# Patient Record
Sex: Female | Born: 1935 | Race: Black or African American | Hispanic: No | Smoking: Never smoker
Health system: Southern US, Community
[De-identification: ages and names within clinical notes are randomized; demographics above are authoritative.]

## PROBLEM LIST (undated history)

## (undated) DIAGNOSIS — I639 Cerebral infarction, unspecified: Secondary | ICD-10-CM

## (undated) DIAGNOSIS — I10 Essential (primary) hypertension: Secondary | ICD-10-CM

---

## 2018-08-27 ENCOUNTER — Emergency Department (HOSPITAL_COMMUNITY)
Admission: EM | Admit: 2018-08-27 | Discharge: 2018-08-27 | Disposition: A | Discharge status: Home / Self Care | Payer: Medicaid Other | Attending: Emergency Medicine | Admitting: Emergency Medicine

## 2018-08-27 ENCOUNTER — Emergency Department (HOSPITAL_COMMUNITY): Payer: Medicaid Other

## 2018-08-27 ENCOUNTER — Encounter (HOSPITAL_COMMUNITY): Payer: Self-pay | Admitting: Emergency Medicine

## 2018-08-27 DIAGNOSIS — W19XXXA Unspecified fall, initial encounter: Secondary | ICD-10-CM

## 2018-08-27 DIAGNOSIS — M25512 Pain in left shoulder: Secondary | ICD-10-CM | POA: Diagnosis not present

## 2018-08-27 HISTORY — DX: Cerebral infarction, unspecified: I63.9

## 2018-08-27 LAB — URINALYSIS, ROUTINE W REFLEX MICROSCOPIC
Bilirubin Urine: NEGATIVE
GLUCOSE, UA: NEGATIVE mg/dL
Ketones, ur: 5 mg/dL — AB
Leukocytes, UA: NEGATIVE
Nitrite: NEGATIVE
PROTEIN: NEGATIVE mg/dL
Specific Gravity, Urine: 1.01 (ref 1.005–1.030)
pH: 7 (ref 5.0–8.0)

## 2018-08-27 LAB — BASIC METABOLIC PANEL
Anion gap: 12 (ref 5–15)
BUN: 14 mg/dL (ref 8–23)
CALCIUM: 8.6 mg/dL — AB (ref 8.9–10.3)
CO2: 25 mmol/L (ref 22–32)
Chloride: 102 mmol/L (ref 98–111)
Creatinine, Ser: 1.02 mg/dL — ABNORMAL HIGH (ref 0.44–1.00)
GFR calc Af Amer: 59 mL/min — ABNORMAL LOW (ref >60)
GFR calc non Af Amer: 51 mL/min — ABNORMAL LOW (ref >60)
Glucose, Bld: 94 mg/dL (ref 70–99)
Potassium: 2.9 mmol/L — ABNORMAL LOW (ref 3.5–5.1)
Sodium: 139 mmol/L (ref 135–145)

## 2018-08-27 LAB — CBC WITH DIFFERENTIAL/PLATELET
Abs Immature Granulocytes: 0.03 10*3/uL (ref 0.00–0.07)
Basophils Absolute: 0 10*3/uL (ref 0.0–0.1)
Basophils Relative: 0 %
Eosinophils Absolute: 0.1 10*3/uL (ref 0.0–0.5)
Eosinophils Relative: 1 %
HCT: 37.4 % (ref 36.0–46.0)
Hemoglobin: 11.8 g/dL — ABNORMAL LOW (ref 12.0–15.0)
Immature Granulocytes: 0 %
Lymphocytes Relative: 18 %
Lymphs Abs: 1.3 10*3/uL (ref 0.7–4.0)
MCH: 27.1 pg (ref 26.0–34.0)
MCHC: 31.6 g/dL (ref 30.0–36.0)
MCV: 85.8 fL (ref 80.0–100.0)
Monocytes Absolute: 0.7 10*3/uL (ref 0.1–1.0)
Monocytes Relative: 10 %
Neutro Abs: 5.2 10*3/uL (ref 1.7–7.7)
Neutrophils Relative %: 71 %
Platelets: 134 10*3/uL — ABNORMAL LOW (ref 150–400)
RBC: 4.36 MIL/uL (ref 3.87–5.11)
RDW: 13.1 % (ref 11.5–15.5)
WBC: 7.3 10*3/uL (ref 4.0–10.5)
nRBC: 0 % (ref 0.0–0.2)

## 2018-08-27 LAB — I-STAT TROPONIN, ED: Troponin i, poc: 0.01 ng/mL (ref 0.00–0.08)

## 2018-08-27 LAB — MAGNESIUM: Magnesium: 2 mg/dL (ref 1.7–2.4)

## 2018-08-27 MED ORDER — POTASSIUM CHLORIDE 20 MEQ/15ML (10%) PO SOLN
40.0000 meq | Freq: Once | ORAL | Status: AC
  Administered 2018-08-27: 40 meq via ORAL
  Filled 2018-08-27: qty 30

## 2018-08-27 MED ORDER — SODIUM CHLORIDE 0.9 % IV BOLUS
500.0000 mL | Freq: Once | INTRAVENOUS | Status: AC
  Administered 2018-08-27: 500 mL via INTRAVENOUS

## 2018-08-27 MED ORDER — ACETAMINOPHEN 325 MG PO TABS
650.0000 mg | ORAL_TABLET | Freq: Once | ORAL | Status: AC
  Administered 2018-08-27: 650 mg via ORAL
  Filled 2018-08-27: qty 2

## 2018-08-27 NOTE — ED Provider Notes (Signed)
Assumed care of patient at 4 PM from Dr. Gwenlyn Fudge.  Patient awaiting urinalysis.  Had a recent fall.  General complaints.  Thus far work-up is unremarkable.  Had some potassium repletion.  Overall unremarkable lab work.  Patient had negative x-rays of her shoulder.  Urinalysis came back without any signs of infection and patient was discharged in the ED in good condition.  Recommend follow-up with primary care doctor.   Virgina Norfolk, DO 08/27/18 1639

## 2018-08-27 NOTE — ED Provider Notes (Signed)
[facility] EMERGENCY [facility]MENT Provider Note   CSN: [id number] Arrival date & time: 08/27/18  1331     History   Chief Complaint Chief Complaint  Patient presents with  . Fall  . Shortness of Breath  . Chest Pain    HPI Emma Crawford is a 85 y.o. female.  HPI  85 year old female presents with left-sided shoulder and back pain.  On 1/16 she fell while stumbling backwards when trying to hold onto her walker.  Daughter witnessed it and she hit her left posterior shoulder on a chair.  She did not hit her head or lose consciousness.  At first did not seem to have any complaints but then over the last few days has described left posterior shoulder pain.  She chronically has left arm pain from a prior stroke with left-sided deficits.  She is also been eating and drinking less or at least having less appetite.  When the daughter gives her drink or applesauce she will drink it.  She might of been a little short of breath a few days ago but none now and no cough, chest pain.  Went to PCPs office where their EKG machine was broken and she was referred here.  Has not received anything for the pain.  Past Medical History:  Diagnosis Date  . Stroke [facility](HCC)     There are no active problems to display for this patient.   History reviewed. No pertinent surgical history.   OB History   No obstetric history on file.      Home Medications    Prior to Admission medications   Not on File    Family History History reviewed. No pertinent family history.  Social History Social History   Tobacco Use  . Smoking status: Never Smoker  . Smokeless tobacco: Former NeurosurgeonUser    Types: Snuff  Substance Use Topics  . Alcohol use: Not on file  . Drug use: Not on file     Allergies   Patient has no allergy information on record.   Review of Systems Review of Systems  Unable to perform ROS: Other     Physical Exam Updated Vital Signs BP (!) 187/94 (BP  Location: Right Arm)   Pulse 77   Temp 98 F (36.7 C) (Oral)   Resp 16   SpO2 100%   Physical Exam Vitals signs and nursing note reviewed.  Constitutional:      Appearance: She is well-developed.  HENT:     Head: Normocephalic and atraumatic.     Right Ear: External ear normal.     Left Ear: External ear normal.     Nose: Nose normal.  Eyes:     General:        Right eye: No discharge.        Left eye: No discharge.  Cardiovascular:     Rate and Rhythm: Normal rate and regular rhythm.     Heart sounds: Normal heart sounds.  Pulmonary:     Effort: Pulmonary effort is normal.     Breath sounds: Normal breath sounds.  Abdominal:     Palpations: Abdomen is soft.     Tenderness: There is no abdominal tenderness.  Musculoskeletal:     Left shoulder: She exhibits tenderness.     Left upper arm: She exhibits tenderness.  Skin:    General: Skin is warm and dry.  Neurological:     Mental Status: She is alert.     Comments: Awake,  alert. 5/5 strength in RUE, RLE, LLE. LUE weaker but also in pain. Some contracture in LUE  Psychiatric:        Mood and Affect: Mood is not anxious.      ED Treatments / Results  Labs (all labs ordered are listed, but only abnormal results are displayed) Labs Reviewed  BASIC METABOLIC PANEL - Abnormal; Notable for the following components:      Result Value   Potassium 2.9 (*)    Creatinine, Ser 1.02 (*)    Calcium 8.6 (*)    GFR calc non Af Amer 51 (*)    GFR calc Af Amer 59 (*)    All other components within normal limits  CBC WITH DIFFERENTIAL/PLATELET - Abnormal; Notable for the following components:   Hemoglobin 11.8 (*)    Platelets 134 (*)    All other components within normal limits  URINALYSIS, ROUTINE W REFLEX MICROSCOPIC  MAGNESIUM  I-STAT TROPONIN, ED    EKG EKG Interpretation  Date/Time:  Monday August 27 2018 13:51:00 EST Ventricular Rate:  69 PR Interval:    QRS Duration: 88 QT Interval:  431 QTC  Calculation: 462 R Axis:   -8 Text Interpretation:  Sinus rhythm Abnormal R-wave progression, early transition Borderline T wave abnormalities No old tracing to compare Confirmed by Pricilla LovelessGoldston, Asyia Hornung [phone]([id number]) on 08/27/2018 2:27:03 PM   Radiology Dg Chest 2 View  Result Date: 08/27/2018 CLINICAL DATA:  Fall with shoulder pain EXAM: CHEST - 2 VIEW COMPARISON:  None. FINDINGS: The heart size and mediastinal contours are within normal limits. Both lungs are clear. The visualized skeletal structures are unremarkable. IMPRESSION: No active cardiopulmonary disease. Electronically Signed   By: Deatra RobinsonKevin  Herman M.D.   On: 08/27/2018 14:50   Dg Shoulder Left  Result Date: 08/27/2018 CLINICAL DATA:  Recent fall with shoulder pain, initial encounter EXAM: LEFT SHOULDER - 2+ VIEW COMPARISON:  None. FINDINGS: Degenerative changes of the acromioclavicular joint are noted. No acute fracture or dislocation is seen. No soft tissue abnormality is noted. IMPRESSION: Degenerative change without acute abnormality. Electronically Signed   By: Alcide CleverMark  Lukens M.D.   On: 08/27/2018 14:51   Dg Humerus Left  Result Date: 08/27/2018 CLINICAL DATA:  Left scapular pain after fall 2 weeks ago. EXAM: LEFT HUMERUS - 2+ VIEW COMPARISON:  Same day shoulder radiographs FINDINGS: The included AC and glenohumeral joints are intact. The elbow joint appears maintained. Left humerus is intact. No joint dislocation is seen. The included views of the scapula appear intact. No adjacent rib fracture is identified. No significant soft tissue abnormality. Ossifications along the expected course of the triceps tendon is noted compatible with triceps tendinosis. IMPRESSION: No acute osseous abnormality of the left humerus. Electronically Signed   By: Tollie Ethavid  Kwon M.D.   On: 08/27/2018 14:54    Procedures Procedures (including critical care time)  Medications Ordered in ED Medications  sodium chloride 0.9 % bolus 500 mL (500 mLs Intravenous New  Bag/Given 08/27/18 1456)  potassium chloride 20 MEQ/15ML (10%) solution 40 mEq (has no administration in time range)  acetaminophen (TYLENOL) tablet 650 mg (650 mg Oral Given 08/27/18 1447)     Initial Impression / Assessment and Plan / ED Course  I have reviewed the triage vital signs and the nursing notes.  Pertinent labs & imaging results that were available during my care of the patient were reviewed by me and considered in my medical decision making (see chart for details).     Patient's fall does  not appear to be syncope or near syncope.  She did not hit her head or lose consciousness and this was several days ago.  X-rays have been obtained and are benign.  Given the generalized weakness endorsed by daughter, labs will be obtained.  Urinalysis pending.  Given no head injury, altered mental state, or new focal weakness, I do not think CT head would be beneficial.  Discussed this with daughter who agrees. Care transferred to Dr. Lockie Mola  Final Clinical Impressions(s) / ED Diagnoses   Final diagnoses:  None    ED Discharge Orders    None       Pricilla Loveless, MD 08/27/18 1554

## 2018-08-27 NOTE — ED Triage Notes (Signed)
Pt to ER for evaluation after a fall and shortness of breath. Pain to left shoulder. Sent here by PCP for further evaluation. States mechanical fall when getting up with walker, fell back and hit her left shoulder. Denies chest pain.

## 2019-01-23 ENCOUNTER — Encounter (HOSPITAL_COMMUNITY): Payer: Self-pay

## 2019-01-23 ENCOUNTER — Emergency Department (HOSPITAL_COMMUNITY): Payer: Medicaid Other

## 2019-01-23 ENCOUNTER — Emergency Department (HOSPITAL_COMMUNITY)
Admission: EM | Admit: 2019-01-23 | Discharge: 2019-01-23 | Disposition: A | Discharge status: Home / Self Care | Payer: Medicaid Other | Attending: Emergency Medicine | Admitting: Emergency Medicine

## 2019-01-23 ENCOUNTER — Other Ambulatory Visit: Payer: Self-pay

## 2019-01-23 DIAGNOSIS — Z7982 Long term (current) use of aspirin: Secondary | ICD-10-CM | POA: Diagnosis not present

## 2019-01-23 DIAGNOSIS — R112 Nausea with vomiting, unspecified: Secondary | ICD-10-CM | POA: Diagnosis present

## 2019-01-23 DIAGNOSIS — R1084 Generalized abdominal pain: Secondary | ICD-10-CM

## 2019-01-23 DIAGNOSIS — Z79899 Other long term (current) drug therapy: Secondary | ICD-10-CM | POA: Diagnosis not present

## 2019-01-23 DIAGNOSIS — Z87891 Personal history of nicotine dependence: Secondary | ICD-10-CM | POA: Diagnosis not present

## 2019-01-23 DIAGNOSIS — I10 Essential (primary) hypertension: Secondary | ICD-10-CM | POA: Diagnosis not present

## 2019-01-23 DIAGNOSIS — K5900 Constipation, unspecified: Secondary | ICD-10-CM | POA: Insufficient documentation

## 2019-01-23 DIAGNOSIS — Z8673 Personal history of transient ischemic attack (TIA), and cerebral infarction without residual deficits: Secondary | ICD-10-CM | POA: Insufficient documentation

## 2019-01-23 DIAGNOSIS — Z880 Allergy status to penicillin: Secondary | ICD-10-CM | POA: Insufficient documentation

## 2019-01-23 HISTORY — DX: Essential (primary) hypertension: I10

## 2019-01-23 LAB — COMPREHENSIVE METABOLIC PANEL
ALT: 14 U/L (ref 0–44)
AST: 21 U/L (ref 15–41)
Albumin: 3.5 g/dL (ref 3.5–5.0)
Alkaline Phosphatase: 84 U/L (ref 38–126)
Anion gap: 15 (ref 5–15)
BUN: 22 mg/dL (ref 8–23)
CO2: 22 mmol/L (ref 22–32)
Calcium: 9.3 mg/dL (ref 8.9–10.3)
Chloride: 103 mmol/L (ref 98–111)
Creatinine, Ser: 1.09 mg/dL — ABNORMAL HIGH (ref 0.44–1.00)
GFR calc Af Amer: 54 mL/min — ABNORMAL LOW (ref >60)
GFR calc non Af Amer: 47 mL/min — ABNORMAL LOW (ref >60)
Glucose, Bld: 131 mg/dL — ABNORMAL HIGH (ref 70–99)
Potassium: 3.2 mmol/L — ABNORMAL LOW (ref 3.5–5.1)
Sodium: 140 mmol/L (ref 135–145)
Total Bilirubin: 1.9 mg/dL — ABNORMAL HIGH (ref 0.3–1.2)
Total Protein: 7.6 g/dL (ref 6.5–8.1)

## 2019-01-23 LAB — URINALYSIS, ROUTINE W REFLEX MICROSCOPIC
Bilirubin Urine: NEGATIVE
Glucose, UA: NEGATIVE mg/dL
Hgb urine dipstick: NEGATIVE
Ketones, ur: 20 mg/dL — AB
Leukocytes,Ua: NEGATIVE
Nitrite: NEGATIVE
Protein, ur: 30 mg/dL — AB
Specific Gravity, Urine: 1.02 (ref 1.005–1.030)
pH: 5 (ref 5.0–8.0)

## 2019-01-23 LAB — CBC WITH DIFFERENTIAL/PLATELET
Abs Immature Granulocytes: 0.04 10*3/uL (ref 0.00–0.07)
Basophils Absolute: 0 10*3/uL (ref 0.0–0.1)
Basophils Relative: 0 %
Eosinophils Absolute: 0 10*3/uL (ref 0.0–0.5)
Eosinophils Relative: 0 %
HCT: 33.6 % — ABNORMAL LOW (ref 36.0–46.0)
Hemoglobin: 10.9 g/dL — ABNORMAL LOW (ref 12.0–15.0)
Immature Granulocytes: 0 %
Lymphocytes Relative: 5 %
Lymphs Abs: 0.7 10*3/uL (ref 0.7–4.0)
MCH: 27.8 pg (ref 26.0–34.0)
MCHC: 32.4 g/dL (ref 30.0–36.0)
MCV: 85.7 fL (ref 80.0–100.0)
Monocytes Absolute: 0.5 10*3/uL (ref 0.1–1.0)
Monocytes Relative: 4 %
Neutro Abs: 11.7 10*3/uL — ABNORMAL HIGH (ref 1.7–7.7)
Neutrophils Relative %: 91 %
Platelets: 252 10*3/uL (ref 150–400)
RBC: 3.92 MIL/uL (ref 3.87–5.11)
RDW: 13.2 % (ref 11.5–15.5)
WBC: 13 10*3/uL — ABNORMAL HIGH (ref 4.0–10.5)
nRBC: 0 % (ref 0.0–0.2)

## 2019-01-23 LAB — I-STAT CHEM 8, ED
BUN: 30 mg/dL — ABNORMAL HIGH (ref 8–23)
Calcium, Ion: 1.07 mmol/L — ABNORMAL LOW (ref 1.15–1.40)
Chloride: 104 mmol/L (ref 98–111)
Creatinine, Ser: 0.8 mg/dL (ref 0.44–1.00)
Glucose, Bld: 128 mg/dL — ABNORMAL HIGH (ref 70–99)
HCT: 37 % (ref 36.0–46.0)
Hemoglobin: 12.6 g/dL (ref 12.0–15.0)
Potassium: 4.3 mmol/L (ref 3.5–5.1)
Sodium: 138 mmol/L (ref 135–145)
TCO2: 24 mmol/L (ref 22–32)

## 2019-01-23 LAB — LIPASE, BLOOD: Lipase: 24 U/L (ref 11–51)

## 2019-01-23 LAB — TROPONIN I: Troponin I: <0.03 ng/mL (ref <0.03)

## 2019-01-23 MED ORDER — LISINOPRIL 10 MG PO TABS
5.0000 mg | ORAL_TABLET | Freq: Once | ORAL | Status: AC
  Administered 2019-01-23: 16:00:00 5 mg via ORAL
  Filled 2019-01-23: qty 1

## 2019-01-23 MED ORDER — METOPROLOL SUCCINATE ER 25 MG PO TB24
25.0000 mg | ORAL_TABLET | Freq: Once | ORAL | Status: AC
  Administered 2019-01-23: 16:00:00 25 mg via ORAL
  Filled 2019-01-23: qty 1

## 2019-01-23 MED ORDER — ONDANSETRON HCL 4 MG/2ML IJ SOLN
4.0000 mg | Freq: Once | INTRAMUSCULAR | Status: AC
  Administered 2019-01-23: 4 mg via INTRAVENOUS
  Filled 2019-01-23: qty 2

## 2019-01-23 MED ORDER — SODIUM CHLORIDE 0.9 % IV BOLUS
1000.0000 mL | Freq: Once | INTRAVENOUS | Status: AC
  Administered 2019-01-23: 12:00:00 1000 mL via INTRAVENOUS

## 2019-01-23 MED ORDER — IOHEXOL 300 MG/ML  SOLN
100.0000 mL | Freq: Once | INTRAMUSCULAR | Status: AC | PRN
  Administered 2019-01-23: 100 mL via INTRAVENOUS

## 2019-01-23 MED ORDER — FLEET ENEMA 7-19 GM/118ML RE ENEM
1.0000 | ENEMA | Freq: Once | RECTAL | Status: AC
  Administered 2019-01-23: 15:00:00 1 via RECTAL
  Filled 2019-01-23: qty 1

## 2019-01-23 NOTE — Discharge Instructions (Signed)
Take 8 scoops of miralax in 32oz of whatever you would like to drink.(Gatorade comes in this size) You can also use a fleets enema which you can buy over the counter at the pharmacy.  Return for worsening abdominal pain, vomiting or fever.   Or you can follow the instructions on the bottle which usually takes about 3 to 4 days to start having significant bowel movements.

## 2019-01-23 NOTE — ED Notes (Signed)
Pt tolerating po.  Denies nausea after drinking ginger ale.

## 2019-01-23 NOTE — ED Provider Notes (Signed)
I received the patient in signout from Dr. Darl Householder, briefly the patient is an 85 year old female with diffuse abdominal pain nausea and vomiting.  Going on for about 3 days.  CT scan with concern for stercoral colitis.  Plan is for enema and oral trial.  I reassessed the patient and she is doing well.  Family would like to take her home at this time.  I recommended MiraLAX to try and improve her symptoms.  She will return for worsening abdominal pain or vomiting.  PCP follow-up.   Deno Etienne, DO 01/23/19 1651

## 2019-01-23 NOTE — ED Notes (Signed)
ED Provider at bedside. 

## 2019-01-23 NOTE — ED Provider Notes (Signed)
[facility] EMERGENCY DEPARTMENT Provider Note   CSN: [id number] Arrival date & time: 01/23/19  1130    History   Chief Complaint Chief Complaint  Patient presents with   Emesis   Abdominal Pain    HPI Emma Crawford is a 85 y.o. female hx of HTN, stroke, here presenting with abdominal pain, vomiting.  Patient lives at home with her daughter. She had poor appetite for the last 2 to 3 days.  She just refused to eat any food.  Daughter tried to give her some Ensure this morning and she threw it back up.  She also threw up her blood pressure medicine this morning.  She went to her doctor this morning and was sent here for further evaluation.  Was given a dose of Zofran but is still vomiting.  Patient was noted to be hypertensive but denies any chest pain or headache.      The history is provided by the patient and a relative.    Past Medical History:  Diagnosis Date   Hypertension    Stroke [facility])     There are no active problems to display for this patient.   History reviewed. No pertinent surgical history.   OB History   No obstetric history on file.      Home Medications    Prior to Admission medications   Medication Sig Start Date End Date Taking? Authorizing Provider  aspirin EC 81 MG tablet Take 81 mg by mouth daily.    [provider]  lisinopril (PRINIVIL,ZESTRIL) 5 MG tablet Take 5 mg by mouth daily.    [provider]  metoprolol succinate (TOPROL-XL) 25 MG 24 hr tablet Take 25 mg by mouth daily.    [provider]  pravastatin (PRAVACHOL) 40 MG tablet Take 40 mg by mouth daily.    [provider]  rosuvastatin (CRESTOR) 20 MG tablet Take 20 mg by mouth at bedtime.    [provider]    Family History No family history on file.  Social History Social History   Tobacco Use   Smoking status: Never Smoker   Smokeless tobacco: Former Systems developer    Types: Snuff  Substance Use Topics    Alcohol use: Not on file   Drug use: Not on file     Allergies   Penicillins   Review of Systems Review of Systems  Gastrointestinal: Positive for abdominal pain and vomiting.  All other systems reviewed and are negative.    Physical Exam Updated Vital Signs BP (!) 200/94    Pulse 87    Temp 98.4 F (36.9 C) (Oral)    Resp (!) 24    SpO2 100%   Physical Exam Vitals signs and nursing note reviewed.  Constitutional:      Comments: Chronically ill, uncomfortable   HENT:     Head: Normocephalic.     Mouth/Throat:     Comments: MM slightly dry  Eyes:     Extraocular Movements: Extraocular movements intact.     Pupils: Pupils are equal, round, and reactive to light.  Cardiovascular:     Rate and Rhythm: Normal rate and regular rhythm.  Pulmonary:     Effort: Pulmonary effort is normal.     Breath sounds: Normal breath sounds.  Abdominal:     General: Abdomen is flat.     Comments: + epigastric tenderness   Skin:    General: Skin is warm.     Capillary Refill: Capillary refill  takes less than 2 seconds.  Neurological:     General: No focal deficit present.     Mental Status: She is alert and oriented to person, place, and time.  Psychiatric:        Mood and Affect: Mood normal.        Behavior: Behavior normal.      ED Treatments / Results  Labs (all labs ordered are listed, but only abnormal results are displayed) Labs Reviewed  CBC WITH DIFFERENTIAL/PLATELET - Abnormal; Notable for the following components:      Result Value   WBC 13.0 (*)    Hemoglobin 10.9 (*)    HCT 33.6 (*)    Neutro Abs 11.7 (*)    All other components within normal limits  COMPREHENSIVE METABOLIC PANEL - Abnormal; Notable for the following components:   Potassium 3.2 (*)    Glucose, Bld 131 (*)    Creatinine, Ser 1.09 (*)    Total Bilirubin 1.9 (*)    GFR calc non Af Amer 47 (*)    GFR calc Af Amer 54 (*)    All other components within normal limits  URINALYSIS, ROUTINE W  REFLEX MICROSCOPIC - Abnormal; Notable for the following components:   Color, Urine AMBER (*)    Ketones, ur 20 (*)    Protein, ur 30 (*)    Bacteria, UA RARE (*)    All other components within normal limits  I-STAT CHEM 8, ED - Abnormal; Notable for the following components:   BUN 30 (*)    Glucose, Bld 128 (*)    Calcium, Ion 1.07 (*)    All other components within normal limits  TROPONIN I  LIPASE, BLOOD    EKG EKG Interpretation  Date/Time:  Wednesday January 23 2019 11:57:57 EDT Ventricular Rate:  96 PR Interval:    QRS Duration: 87 QT Interval:  396 QTC Calculation: 501 R Axis:   -17 Text Interpretation:  Sinus rhythm Left ventricular hypertrophy Inferior infarct, old Probable anterior infarct, age indeterminate Prolonged QT interval Artifact in lead(s) I III aVR aVL aVF No significant change since last tracing Confirmed by Richardean CanalYao, Preciliano Castell H [phone]([id number]) on 01/23/2019 11:59:18 AM Also confirmed by Richardean CanalYao, Oriana Horiuchi H [phone]), editor Barbette Hairassel, Kerry [phone]([id number])  on 01/23/2019 1:36:52 PM   Radiology Ct Abdomen Pelvis W Contrast  Result Date: 01/23/2019 CLINICAL DATA:  Abdominal distension EXAM: CT ABDOMEN AND PELVIS WITH CONTRAST TECHNIQUE: Multidetector CT imaging of the abdomen and pelvis was performed using the standard protocol following bolus administration of intravenous contrast. Sagittal and coronal MPR images reconstructed from axial data set. CONTRAST:  100mL OMNIPAQUE IOHEXOL 300 MG/ML SOLN IV. No oral contrast. COMPARISON:  None FINDINGS: Lower chest: Lung bases clear Hepatobiliary: 2 large peripherally calcified gallstones in gallbladder up to 3.3 diameter. No definite gallbladder wall thickening. Liver unremarkable. Pancreas: Atrophic pancreas without mass Spleen: Normal appearance Adrenals/Urinary Tract: Thickening of adrenal glands with question focal nodule 17 x 11 mm in LEFT adrenal gland image 29. Kidneys, ureters, and bladder unremarkable. Stomach/Bowel: Normal appendix. Stomach and small  bowel loops normal appearance. Significantly increased stool in rectosigmoid colon with diffuse dilatation of colon from mid ascending to rectum, greatest at rectum. Minimal wall thickening of the rectum question stercoral colitis. No mass or stricture identified. Vascular/Lymphatic: Atherosclerotic calcifications aorta and iliac arteries without aneurysm. No adenopathy. Few surgical clips adjacent to aorta and IVC. Reproductive: Uterus surgically absent. Nonvisualization of ovaries. Other: No free air or free fluid.  No hernia. Musculoskeletal: Bones  diffusely demineralized. 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 Height losses of multiple thoracolumbar vertebra. IMPRESSION: Colon is diffusely distended throughout its length by gas, fluid, and stool, greatest at the sigmoid colon which is markedly distended by stool up to 9.5 cm diameter. Minimal wall thickening of the rectum question stercoral colitis. Cholelithiasis. 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 Electronically Signed   By: Ulyses SouthwardMark  Boles M.D.   On: 01/23/2019 14:51   Dg Chest Port 1 View  Result Date: 01/23/2019 CLINICAL DATA:  Nausea and vomiting.  Lower abdominal pain. EXAM: PORTABLE CHEST 1 VIEW COMPARISON:  Chest x-ray dated August 27, 2018. FINDINGS: The heart size and mediastinal contours are within normal limits. Normal pulmonary vascularity. Low lung volumes. No focal consolidation, pleural effusion, or pneumothorax. No acute osseous abnormality. Partially visualized colonic distention. IMPRESSION: 1.  No active cardiopulmonary disease. 2. Partially visualized colonic distention. Dedicated abdominal x-rays suggested for further evaluation. Electronically Signed   By: Obie DredgeWilliam T Derry M.D.   On: 01/23/2019 13:44    Procedures Procedures (including critical care time)  Medications Ordered in ED Medications  sodium chloride 0.9 % bolus  1,000 mL (1,000 mLs Intravenous New Bag/Given 01/23/19 1208)  ondansetron (ZOFRAN) injection 4 mg (4 mg Intravenous Given 01/23/19 1211)  iohexol (OMNIPAQUE) 300 MG/ML solution 100 mL (100 mLs Intravenous Contrast Given 01/23/19 1403)  sodium phosphate (FLEET) 7-19 GM/118ML enema 1 enema (1 enema Rectal Given 01/23/19 1435)     Initial Impression / Assessment and Plan / ED Course  I have reviewed the triage vital signs and the nursing notes.  Pertinent labs & imaging results that were available during my care of the patient were reviewed by me and considered in my medical decision making (see chart for details).       Emma LaineMary Louise Crawford is a 85 y.o. female here with abdominal pain, vomiting. Likely viral gastroenteritis. She is hypertensive but vomited up her BP meds. Will get labs, trop, lipase, CXR, CT ab/pel, UA.   3:23 PM Labs unremarkable. UA and labs unremarkable. Rectal exam showed soft stools and CT showed stericolitis. Afebrile, I doubt bacterial colitis. Ordered enema. If patient has a bowel movement and tolerates PO, anticipate dc home with daughter. Signed out to Dr. Adela LankFloyd    Final Clinical Impressions(s) / ED Diagnoses   Final diagnoses:  None    ED Discharge Orders    None       Charlynne PanderYao, Paizley Ramella Hsienta, MD 01/23/19 1525

## 2019-01-23 NOTE — ED Triage Notes (Signed)
Family at bedside reports pt has not eaten in 2.5 days due to emesis and abd pain. Pt went to PCP just pta, they gave her a shot for her nausea with improvement. Pt c.o lower abd pain, last BM this morning.

## 2019-06-01 ENCOUNTER — Encounter (HOSPITAL_COMMUNITY): Payer: Self-pay | Admitting: Emergency Medicine

## 2019-06-01 ENCOUNTER — Other Ambulatory Visit: Payer: Self-pay

## 2019-06-01 ENCOUNTER — Emergency Department (HOSPITAL_COMMUNITY)
Admission: EM | Admit: 2019-06-01 | Discharge: 2019-06-01 | Disposition: A | Discharge status: Home / Self Care | Payer: Medicare Other | Attending: Emergency Medicine | Admitting: Emergency Medicine

## 2019-06-01 DIAGNOSIS — K5900 Constipation, unspecified: Secondary | ICD-10-CM

## 2019-06-01 DIAGNOSIS — Z8673 Personal history of transient ischemic attack (TIA), and cerebral infarction without residual deficits: Secondary | ICD-10-CM | POA: Insufficient documentation

## 2019-06-01 DIAGNOSIS — Z79899 Other long term (current) drug therapy: Secondary | ICD-10-CM | POA: Diagnosis not present

## 2019-06-01 DIAGNOSIS — Z7982 Long term (current) use of aspirin: Secondary | ICD-10-CM | POA: Insufficient documentation

## 2019-06-01 DIAGNOSIS — I10 Essential (primary) hypertension: Secondary | ICD-10-CM | POA: Diagnosis not present

## 2019-06-01 DIAGNOSIS — R2241 Localized swelling, mass and lump, right lower limb: Secondary | ICD-10-CM | POA: Insufficient documentation

## 2019-06-01 DIAGNOSIS — K625 Hemorrhage of anus and rectum: Secondary | ICD-10-CM | POA: Diagnosis not present

## 2019-06-01 DIAGNOSIS — Z20828 Contact with and (suspected) exposure to other viral communicable diseases: Secondary | ICD-10-CM | POA: Insufficient documentation

## 2019-06-01 DIAGNOSIS — M7989 Other specified soft tissue disorders: Secondary | ICD-10-CM

## 2019-06-01 LAB — COMPREHENSIVE METABOLIC PANEL
ALT: 12 U/L (ref 0–44)
AST: 19 U/L (ref 15–41)
Albumin: 3.9 g/dL (ref 3.5–5.0)
Alkaline Phosphatase: 91 U/L (ref 38–126)
Anion gap: 14 (ref 5–15)
BUN: 26 mg/dL — ABNORMAL HIGH (ref 8–23)
CO2: 24 mmol/L (ref 22–32)
Calcium: 9.4 mg/dL (ref 8.9–10.3)
Chloride: 101 mmol/L (ref 98–111)
Creatinine, Ser: 1.01 mg/dL — ABNORMAL HIGH (ref 0.44–1.00)
GFR calc Af Amer: 60 mL/min — ABNORMAL LOW (ref >60)
GFR calc non Af Amer: 51 mL/min — ABNORMAL LOW (ref >60)
Glucose, Bld: 135 mg/dL — ABNORMAL HIGH (ref 70–99)
Potassium: 3.3 mmol/L — ABNORMAL LOW (ref 3.5–5.1)
Sodium: 139 mmol/L (ref 135–145)
Total Bilirubin: 1.3 mg/dL — ABNORMAL HIGH (ref 0.3–1.2)
Total Protein: 7.8 g/dL (ref 6.5–8.1)

## 2019-06-01 LAB — URINALYSIS, ROUTINE W REFLEX MICROSCOPIC
Bilirubin Urine: NEGATIVE
Glucose, UA: NEGATIVE mg/dL
Ketones, ur: 20 mg/dL — AB
Leukocytes,Ua: NEGATIVE
Nitrite: NEGATIVE
Protein, ur: NEGATIVE mg/dL
Specific Gravity, Urine: 1.014 (ref 1.005–1.030)
pH: 6 (ref 5.0–8.0)

## 2019-06-01 LAB — CBC WITH DIFFERENTIAL/PLATELET
Abs Immature Granulocytes: 0.03 10*3/uL (ref 0.00–0.07)
Basophils Absolute: 0 10*3/uL (ref 0.0–0.1)
Basophils Relative: 0 %
Eosinophils Absolute: 0 10*3/uL (ref 0.0–0.5)
Eosinophils Relative: 0 %
HCT: 37.9 % (ref 36.0–46.0)
Hemoglobin: 11.9 g/dL — ABNORMAL LOW (ref 12.0–15.0)
Immature Granulocytes: 0 %
Lymphocytes Relative: 14 %
Lymphs Abs: 1.2 10*3/uL (ref 0.7–4.0)
MCH: 27.5 pg (ref 26.0–34.0)
MCHC: 31.4 g/dL (ref 30.0–36.0)
MCV: 87.7 fL (ref 80.0–100.0)
Monocytes Absolute: 0.3 10*3/uL (ref 0.1–1.0)
Monocytes Relative: 4 %
Neutro Abs: 6.8 10*3/uL (ref 1.7–7.7)
Neutrophils Relative %: 82 %
Platelets: 175 10*3/uL (ref 150–400)
RBC: 4.32 MIL/uL (ref 3.87–5.11)
RDW: 13.2 % (ref 11.5–15.5)
WBC: 8.3 10*3/uL (ref 4.0–10.5)
nRBC: 0 % (ref 0.0–0.2)

## 2019-06-01 MED ORDER — ONDANSETRON HCL 4 MG/2ML IJ SOLN
4.0000 mg | Freq: Once | INTRAMUSCULAR | Status: AC
  Administered 2019-06-01: 09:00:00 4 mg via INTRAVENOUS
  Filled 2019-06-01: qty 2

## 2019-06-01 MED ORDER — SORBITOL 70 % SOLN
960.0000 mL | TOPICAL_OIL | Freq: Once | ORAL | Status: AC
  Administered 2019-06-01: 960 mL via RECTAL
  Filled 2019-06-01: qty 473

## 2019-06-01 MED ORDER — SODIUM CHLORIDE 0.9 % IV SOLN
INTRAVENOUS | Status: DC
  Administered 2019-06-01: 13:00:00 via INTRAVENOUS

## 2019-06-01 NOTE — ED Provider Notes (Signed)
[facility] Provider Note   CSN: [id number] Arrival date & time: 06/01/19  [id number]     History   Chief Complaint Chief Complaint  Patient presents with  . Fever    HPI Emma Crawford is a 85 y.o. female.     HPI   Patient sent here by family members to be evaluated for possible urinary tract infection.  She is unable to give any history.  Level 5 caveat-altered mental status.  Past Medical History:  Diagnosis Date  . Hypertension   . Stroke [facility](HCC)     There are no active problems to display for this patient.   History reviewed. No pertinent surgical history.   OB History   No obstetric history on file.      Home Medications    Prior to Admission medications   Medication Sig Start Date End Date Taking? Authorizing Provider  aspirin EC 81 MG tablet Take 81 mg by mouth daily.   Yes [provider]  carvedilol (COREG) 6.25 MG tablet Take 6.25 mg by mouth 2 (two) times daily. 04/17/19  Yes [provider]  DERMA-SMOOTHE/FS BODY 0.01 % OIL Apply 1 application topically 3 (three) times daily. Apply a thin film to left arm and right leg three times a day 04/02/19  Yes [provider]  gabapentin (NEURONTIN) 100 MG capsule Take 100 mg by mouth 3 (three) times daily. 04/17/19  Yes [provider]  lidocaine (XYLOCAINE) 5 % ointment Apply 1 application topically 3 (three) times daily as needed. Apply topically to left shoulder three times a day as needed for pain 01/16/19  Yes [provider]  LINZESS 72 MCG capsule Take 72 mcg by mouth daily as needed. 03/16/19  Yes [provider]  lisinopril (ZESTRIL) 30 MG tablet Take 30 mg by mouth daily.   Yes [provider]  pravastatin (PRAVACHOL) 40 MG tablet Take 40 mg by mouth daily.   Yes [provider]  Vitamin D, Ergocalciferol, (DRISDOL) 1.25 MG (50000 UT) CAPS capsule Take 50,000 Units by mouth once a week. 02/07/19   [provider]    Family History History reviewed. No pertinent family history.  Social History Social History   Tobacco Use  . Smoking status: Never Smoker  . Smokeless tobacco: Former NeurosurgeonUser    Types: Snuff  Substance Use Topics  . Alcohol use: Not Currently  . Drug use: Not Currently     Allergies   Penicillins   Review of Systems Review of Systems  Unable to perform ROS: Mental status change     Physical Exam Updated Vital Signs BP (!) 174/91 (BP Location: Right Arm)   Pulse 78   Temp 97.6 F (36.4 C) (Oral)   Resp 18   Ht 5\' 6"  (1.676 m)   Wt 72.6 kg   SpO2 94%   BMI 25.82 kg/m   Physical Exam Vitals signs and nursing note reviewed.  Constitutional:      General: She is not in acute distress.    Appearance: She is well-developed. She is not ill-appearing, toxic-appearing or diaphoretic.  HENT:     Head: Normocephalic and atraumatic.     Right Ear: External ear normal.     Left Ear: External ear normal.     Mouth/Throat:     Mouth: Mucous membranes are moist.  Eyes:     Conjunctiva/sclera: Conjunctivae normal.     Pupils: Pupils are equal, round, and reactive to light.  Neck:  Musculoskeletal: Normal range of motion and neck supple.     Trachea: Phonation normal.  Cardiovascular:     Rate and Rhythm: Normal rate and regular rhythm.     Heart sounds: Murmur present.  Pulmonary:     Effort: Pulmonary effort is normal. No respiratory distress.     Breath sounds: Normal breath sounds. No stridor.  Abdominal:     General: There is no distension.     Palpations: Abdomen is soft.     Tenderness: There is no abdominal tenderness. There is no guarding.  Genitourinary:    Comments: Moderate amount of stool in rectal vault, brown, firm.  No fecal impaction. Musculoskeletal: Normal range of motion.     Comments: She has mild swelling of the right thigh and right lower leg, as compared to the left.  She is able to independently elevate both legs off the  stretcher and hold there for 5 seconds.  Skin:    General: Skin is warm and dry.  Neurological:     Mental Status: She is alert.     Cranial Nerves: No cranial nerve deficit.     Sensory: No sensory deficit.     Motor: No abnormal muscle tone.     Coordination: Coordination normal.  Psychiatric:        Mood and Affect: Mood normal.        Behavior: Behavior normal.      ED Treatments / Results  Labs (all labs ordered are listed, but only abnormal results are displayed) Labs Reviewed  URINALYSIS, ROUTINE W REFLEX MICROSCOPIC - Abnormal; Notable for the following components:      Result Value   Hgb urine dipstick SMALL (*)    Ketones, ur 20 (*)    Bacteria, UA RARE (*)    All other components within normal limits  COMPREHENSIVE METABOLIC PANEL - Abnormal; Notable for the following components:   Potassium 3.3 (*)    Glucose, Bld 135 (*)    BUN 26 (*)    Creatinine, Ser 1.01 (*)    Total Bilirubin 1.3 (*)    GFR calc non Af Amer 51 (*)    GFR calc Af Amer 60 (*)    All other components within normal limits  CBC WITH DIFFERENTIAL/PLATELET - Abnormal; Notable for the following components:   Hemoglobin 11.9 (*)    All other components within normal limits  SARS CORONAVIRUS 2 (TAT 6-24 HRS)    EKG None  Radiology No results found.  Procedures Procedures (including critical care time)  Medications Ordered in ED Medications  0.9 %  sodium chloride infusion ( Intravenous New Bag/Given 06/01/19 1258)  ondansetron (ZOFRAN) injection 4 mg (4 mg Intravenous Given 06/01/19 0912)  sorbitol, milk of mag, mineral oil, glycerin (SMOG) enema (960 mLs Rectal Given 06/01/19 1258)     Initial Impression / Assessment and Plan / ED Course  I have reviewed the triage vital signs and the nursing notes.  Pertinent labs & imaging results that were available during my care of the patient were reviewed by me and considered in my medical decision making (see chart for details).   Clinical Course as of May 31 1453  Sat Jun 01, 2019  1145 Patient's daughter is now here is able to give additional history.  Patient is here because of vomiting, once last evening about 10 PM and this morning, prior to her daughter getting her up.  Also, the patient has not had a bowel movement for 2 weeks.  She is using Linzess without relief of her constipation.  Her daughter is also concerned about some right leg swelling.  Unknown timeframe for that.  No other recent illnesses or known sick contacts.   [EW]  1416 Normal except potassium low, glucose high, BUN high, creatinine high  Comprehensive metabolic panel(!) [EW]  1416 Normal except hemoglobin low  CBC with Differential(!) [EW]  1416 Normal except small amount of hemoglobin ketones present with rare bacteria   [EW]  1425 Per daughter, with the patient, she has had multiple stools since receiving the enema.  Her daughter feels that the patient is not currently in pain.  She states that patient has had trouble with constipation like this previously and typically improves after an enema.   [EW]    Clinical Course User Index [EW] Mancel Bale, MD        Patient Vitals for the past 24 hrs:  BP Temp Temp src Pulse Resp SpO2 Height Weight  06/01/19 1432 (!) 174/91 97.6 F (36.4 C) Oral 78 18 94 % - -  06/01/19 1230 (!) 203/107 - - 79 - 97 % - -  06/01/19 1200 (!) 205/105 - - 86 - 98 % - -  06/01/19 1130 (!) 200/118 - - 84 - 100 % - -  06/01/19 1114 (!) 209/116 99.1 F (37.3 C) Oral 85 18 96 % - -  06/01/19 1100 (!) 210/121 - - 86 - 96 % - -  06/01/19 1045 - - - 82 - 98 % - -  06/01/19 1044 - - - 80 - 96 % - -  06/01/19 1033 (!) 216/107 - - - - - - -  06/01/19 0909 - - - - - -  (1.676 m) 72.6 kg  06/01/19 0900 (!) 225/114 98.6 F (37 C) Oral 93 20 98 % - -    2:54 PM Reevaluation with update and discussion. After initial assessment and treatment, an updated evaluation reveals no additional complaints or concerns.   Findings discussed with patient's daughter and all questions were answered. Mancel Bale   Medical Decision Making: Malaise with decreased intake, and vomiting.  History of constipation on Linzess.  Moderate constipation on exam, improved with enema.  Patient improved symptomatically with decreased blood pressure following treatment.  Small amount of occult rectal bleeding present, with reassuring hemoglobin 11.9.  No evident cardiovascular instability.  Trace abnormalities, potassium BUN/creatinine likely will improve with better hydration and nutrition.  No indication for hospitalization at this time.  CRITICAL CARE-no Performed by: Mancel Bale  Nursing Notes Reviewed/ Care Coordinated Applicable Imaging Reviewed Interpretation of Laboratory Data incorporated into ED treatment  The patient appears reasonably screened and/or stabilized for discharge and I doubt any other medical condition or other [facility] requiring further screening, evaluation, or treatment in the ED at this time prior to discharge.  Plan: Home Medications-continue usual, use fleets enema as needed; Home Treatments-increase fluid and fiber in diet; return here if the recommended treatment, does not improve the symptoms; Recommended follow up-PCP checkup 1 week and as needed   Final Clinical Impressions(s) / ED Diagnoses   Final diagnoses:  Constipation, unspecified constipation type  Right leg swelling  Rectal bleeding    ED Discharge Orders         Ordered    Home Health     06/01/19 1448    Face-to-face encounter (required for Medicare/Medicaid patients)    Comments: I Mancel Bale certify that this patient is under my care and that  I, or a nurse practitioner or physician's assistant working with me, had a face-to-face encounter that meets the physician face-to-face encounter requirements with this patient on 06/01/2019. The encounter with the patient was in whole, or in part for the following medical condition(s)  which is the primary reason for home health care (List medical condition): constipation   06/01/19 1448           Daleen Bo, MD 06/01/19 1457

## 2019-06-01 NOTE — ED Triage Notes (Signed)
Pt brought in from home for evaluation of possible uti. Fever, vomiting, and strong smell to urine.

## 2019-06-01 NOTE — ED Notes (Signed)
Call in to case management

## 2019-06-01 NOTE — ED Notes (Signed)
Pt mumbling and this writier cannot understand what pt is attempting to say

## 2019-06-01 NOTE — ED Notes (Signed)
Pt continues to seep liquid brown, odoriferious stool  She has been changed x 3

## 2019-06-01 NOTE — ED Notes (Signed)
Pt with liquid stool x 7   This liquid stool up back, down off the end of the bed  Daughter request EMS to return pt home

## 2019-06-01 NOTE — ED Notes (Signed)
Called EMS to transfer Pt back home as requested by family and Pts nurse.  Daughter to meet her there.

## 2019-06-01 NOTE — ED Notes (Signed)
Call to pharm They will bring enema ingredients

## 2019-06-01 NOTE — ED Notes (Signed)
Enema given per MD order with return of great amount of liquid stool Pt currently seeping stool   While enema given pt vomited - she was suctioned and after suction and cleaning, enema was resumed  Pt cleansed and housekeeping called to empty hamper of soiled linens

## 2019-06-01 NOTE — ED Notes (Signed)
Cleaned of liquid stool   To Occupational psychologist home

## 2019-06-01 NOTE — ED Notes (Signed)
Dr Wentz in to reassess 

## 2019-06-01 NOTE — ED Notes (Signed)
Pt cleaned - again   Awaiting transport home

## 2019-06-01 NOTE — Discharge Instructions (Signed)
Encourage her to drink plenty of fluids, especially water.  Continue to utilize a high-fiber diet and foods as suggested on the attached document.  If she continues to be constipated and/or not eating, use a fleets enema once or twice a day for a couple of days.  When you see her doctor, next week have him look at her right leg to see if it continues to have swelling.  Also, let him know that there was a small amount of blood in the stool today.  Blood in the stool is common with constipation so may not be a chronic problem.  Her blood count is nearly normal today.  Your doctor may choose to recheck the blood count to make sure it is staying near normal.

## 2019-06-01 NOTE — Progress Notes (Signed)
CSW received consult for [facility] referral- CSW received denials from the following [facility] agencies due to patient having Medicaid as her primary insurance: [facility], [facility], [facility], [facility], and [employer] [facility]. CSW notified MD of potential for not having [place] services follow at DC.   Kingsley Spittle, LCSW [facility]  [phone]

## 2019-06-02 LAB — SARS CORONAVIRUS 2 (TAT 6-24 HRS): SARS Coronavirus 2: NEGATIVE

## 2019-07-01 ENCOUNTER — Inpatient Hospital Stay (HOSPITAL_COMMUNITY)
Admit: 2019-07-01 | Discharge: 2019-07-01 | Disposition: A | Discharge status: Home / Self Care | Payer: Medicare Other | Attending: Internal Medicine | Admitting: Internal Medicine

## 2019-07-01 ENCOUNTER — Inpatient Hospital Stay (HOSPITAL_COMMUNITY): Payer: Medicare Other

## 2019-07-01 ENCOUNTER — Inpatient Hospital Stay (HOSPITAL_COMMUNITY)
Admission: EM | Admit: 2019-07-01 | Discharge: 2019-07-05 | DRG: 871 | Disposition: A | Discharge status: Hospice | Payer: Medicare Other | Attending: Internal Medicine | Admitting: Internal Medicine

## 2019-07-01 ENCOUNTER — Emergency Department (HOSPITAL_COMMUNITY): Payer: Medicare Other

## 2019-07-01 ENCOUNTER — Other Ambulatory Visit: Payer: Self-pay

## 2019-07-01 ENCOUNTER — Encounter (HOSPITAL_COMMUNITY): Payer: Self-pay | Admitting: Emergency Medicine

## 2019-07-01 DIAGNOSIS — R569 Unspecified convulsions: Secondary | ICD-10-CM | POA: Diagnosis not present

## 2019-07-01 DIAGNOSIS — I639 Cerebral infarction, unspecified: Secondary | ICD-10-CM

## 2019-07-01 DIAGNOSIS — E538 Deficiency of other specified B group vitamins: Secondary | ICD-10-CM | POA: Diagnosis not present

## 2019-07-01 DIAGNOSIS — Z7189 Other specified counseling: Secondary | ICD-10-CM

## 2019-07-01 DIAGNOSIS — I129 Hypertensive chronic kidney disease with stage 1 through stage 4 chronic kidney disease, or unspecified chronic kidney disease: Secondary | ICD-10-CM | POA: Diagnosis present

## 2019-07-01 DIAGNOSIS — J320 Chronic maxillary sinusitis: Secondary | ICD-10-CM | POA: Diagnosis not present

## 2019-07-01 DIAGNOSIS — Z79899 Other long term (current) drug therapy: Secondary | ICD-10-CM | POA: Diagnosis not present

## 2019-07-01 DIAGNOSIS — Z515 Encounter for palliative care: Secondary | ICD-10-CM | POA: Diagnosis present

## 2019-07-01 DIAGNOSIS — I69391 Dysphagia following cerebral infarction: Secondary | ICD-10-CM | POA: Diagnosis not present

## 2019-07-01 DIAGNOSIS — N390 Urinary tract infection, site not specified: Secondary | ICD-10-CM | POA: Diagnosis not present

## 2019-07-01 DIAGNOSIS — E876 Hypokalemia: Secondary | ICD-10-CM | POA: Diagnosis not present

## 2019-07-01 DIAGNOSIS — E860 Dehydration: Secondary | ICD-10-CM | POA: Diagnosis not present

## 2019-07-01 DIAGNOSIS — I69354 Hemiplegia and hemiparesis following cerebral infarction affecting left non-dominant side: Secondary | ICD-10-CM

## 2019-07-01 DIAGNOSIS — Z880 Allergy status to penicillin: Secondary | ICD-10-CM | POA: Diagnosis not present

## 2019-07-01 DIAGNOSIS — E785 Hyperlipidemia, unspecified: Secondary | ICD-10-CM

## 2019-07-01 DIAGNOSIS — Z7982 Long term (current) use of aspirin: Secondary | ICD-10-CM | POA: Diagnosis not present

## 2019-07-01 DIAGNOSIS — N183 Chronic kidney disease, stage 3 unspecified: Secondary | ICD-10-CM | POA: Diagnosis present

## 2019-07-01 DIAGNOSIS — Z66 Do not resuscitate: Secondary | ICD-10-CM | POA: Diagnosis present

## 2019-07-01 DIAGNOSIS — G9341 Metabolic encephalopathy: Secondary | ICD-10-CM | POA: Diagnosis not present

## 2019-07-01 DIAGNOSIS — G9340 Encephalopathy, unspecified: Secondary | ICD-10-CM | POA: Diagnosis present

## 2019-07-01 DIAGNOSIS — I959 Hypotension, unspecified: Secondary | ICD-10-CM | POA: Diagnosis not present

## 2019-07-01 DIAGNOSIS — I10 Essential (primary) hypertension: Secondary | ICD-10-CM

## 2019-07-01 DIAGNOSIS — F0390 Unspecified dementia without behavioral disturbance: Secondary | ICD-10-CM | POA: Diagnosis not present

## 2019-07-01 DIAGNOSIS — A419 Sepsis, unspecified organism: Secondary | ICD-10-CM | POA: Diagnosis not present

## 2019-07-01 DIAGNOSIS — F17200 Nicotine dependence, unspecified, uncomplicated: Secondary | ICD-10-CM | POA: Diagnosis not present

## 2019-07-01 DIAGNOSIS — G8194 Hemiplegia, unspecified affecting left nondominant side: Secondary | ICD-10-CM | POA: Diagnosis not present

## 2019-07-01 DIAGNOSIS — N179 Acute kidney failure, unspecified: Secondary | ICD-10-CM | POA: Diagnosis not present

## 2019-07-01 DIAGNOSIS — E870 Hyperosmolality and hypernatremia: Secondary | ICD-10-CM | POA: Diagnosis not present

## 2019-07-01 DIAGNOSIS — Z20828 Contact with and (suspected) exposure to other viral communicable diseases: Secondary | ICD-10-CM | POA: Diagnosis not present

## 2019-07-01 LAB — APTT: aPTT: 35 seconds (ref 24–36)

## 2019-07-01 LAB — COMPREHENSIVE METABOLIC PANEL
ALT: 12 U/L (ref 0–44)
AST: 19 U/L (ref 15–41)
Albumin: 3.2 g/dL — ABNORMAL LOW (ref 3.5–5.0)
Alkaline Phosphatase: 258 U/L — ABNORMAL HIGH (ref 38–126)
Anion gap: 10 (ref 5–15)
BUN: 45 mg/dL — ABNORMAL HIGH (ref 8–23)
CO2: 22 mmol/L (ref 22–32)
Calcium: 8.7 mg/dL — ABNORMAL LOW (ref 8.9–10.3)
Chloride: 111 mmol/L (ref 98–111)
Creatinine, Ser: 1.23 mg/dL — ABNORMAL HIGH (ref 0.44–1.00)
GFR calc Af Amer: 47 mL/min — ABNORMAL LOW (ref >60)
GFR calc non Af Amer: 41 mL/min — ABNORMAL LOW (ref >60)
Glucose, Bld: 136 mg/dL — ABNORMAL HIGH (ref 70–99)
Potassium: 4.3 mmol/L (ref 3.5–5.1)
Sodium: 143 mmol/L (ref 135–145)
Total Bilirubin: 1.2 mg/dL (ref 0.3–1.2)
Total Protein: 7.2 g/dL (ref 6.5–8.1)

## 2019-07-01 LAB — BLOOD GAS, ARTERIAL
Acid-base deficit: 2.3 mmol/L — ABNORMAL HIGH (ref 0.0–2.0)
Bicarbonate: 22.5 mmol/L (ref 20.0–28.0)
FIO2: 21
O2 Saturation: 91.3 %
Patient temperature: 37
pCO2 arterial: 37 mmHg (ref 32.0–48.0)
pH, Arterial: 7.389 (ref 7.350–7.450)
pO2, Arterial: 65 mmHg — ABNORMAL LOW (ref 83.0–108.0)

## 2019-07-01 LAB — TSH: TSH: 0.13 u[IU]/mL — ABNORMAL LOW (ref 0.350–4.500)

## 2019-07-01 LAB — I-STAT CHEM 8, ED
BUN: 41 mg/dL — ABNORMAL HIGH (ref 8–23)
Calcium, Ion: 1.19 mmol/L (ref 1.15–1.40)
Chloride: 109 mmol/L (ref 98–111)
Creatinine, Ser: 1.2 mg/dL — ABNORMAL HIGH (ref 0.44–1.00)
Glucose, Bld: 131 mg/dL — ABNORMAL HIGH (ref 70–99)
HCT: 41 % (ref 36.0–46.0)
Hemoglobin: 13.9 g/dL (ref 12.0–15.0)
Potassium: 4.3 mmol/L (ref 3.5–5.1)
Sodium: 144 mmol/L (ref 135–145)
TCO2: 22 mmol/L (ref 22–32)

## 2019-07-01 LAB — URINALYSIS, ROUTINE W REFLEX MICROSCOPIC
Glucose, UA: NEGATIVE mg/dL
Ketones, ur: 5 mg/dL — AB
Nitrite: NEGATIVE
Protein, ur: 100 mg/dL — AB
RBC / HPF: >50 RBC/hpf — ABNORMAL HIGH (ref 0–5)
Specific Gravity, Urine: 1.017 (ref 1.005–1.030)
WBC, UA: >50 WBC/hpf — ABNORMAL HIGH (ref 0–5)
pH: 6 (ref 5.0–8.0)

## 2019-07-01 LAB — DIFFERENTIAL
Abs Immature Granulocytes: 0.07 10*3/uL (ref 0.00–0.07)
Basophils Absolute: 0 10*3/uL (ref 0.0–0.1)
Basophils Relative: 0 %
Eosinophils Absolute: 0 10*3/uL (ref 0.0–0.5)
Eosinophils Relative: 0 %
Immature Granulocytes: 1 %
Lymphocytes Relative: 13 %
Lymphs Abs: 1.7 10*3/uL (ref 0.7–4.0)
Monocytes Absolute: 1 10*3/uL (ref 0.1–1.0)
Monocytes Relative: 8 %
Neutro Abs: 10.4 10*3/uL — ABNORMAL HIGH (ref 1.7–7.7)
Neutrophils Relative %: 78 %

## 2019-07-01 LAB — RAPID URINE DRUG SCREEN, HOSP PERFORMED
Amphetamines: NOT DETECTED
Barbiturates: NOT DETECTED
Benzodiazepines: NOT DETECTED
Cocaine: NOT DETECTED
Opiates: NOT DETECTED
Tetrahydrocannabinol: NOT DETECTED

## 2019-07-01 LAB — CBC
HCT: 42.5 % (ref 36.0–46.0)
Hemoglobin: 13.1 g/dL (ref 12.0–15.0)
MCH: 27 pg (ref 26.0–34.0)
MCHC: 30.8 g/dL (ref 30.0–36.0)
MCV: 87.4 fL (ref 80.0–100.0)
Platelets: 235 10*3/uL (ref 150–400)
RBC: 4.86 MIL/uL (ref 3.87–5.11)
RDW: 13.4 % (ref 11.5–15.5)
WBC: 13.2 10*3/uL — ABNORMAL HIGH (ref 4.0–10.5)
nRBC: 0 % (ref 0.0–0.2)

## 2019-07-01 LAB — ETHANOL: Alcohol, Ethyl (B): <10 mg/dL (ref <10)

## 2019-07-01 LAB — T4, FREE: Free T4: 1.65 ng/dL — ABNORMAL HIGH (ref 0.61–1.12)

## 2019-07-01 LAB — SARS CORONAVIRUS 2 BY RT PCR (HOSPITAL ORDER, PERFORMED IN ~~LOC~~ HOSPITAL LAB): SARS Coronavirus 2: NEGATIVE

## 2019-07-01 LAB — PROTIME-INR
INR: 1.2 (ref 0.8–1.2)
Prothrombin Time: 14.9 seconds (ref 11.4–15.2)

## 2019-07-01 LAB — MRSA PCR SCREENING: MRSA by PCR: NEGATIVE

## 2019-07-01 LAB — LACTIC ACID, PLASMA: Lactic Acid, Venous: 0.8 mmol/L (ref 0.5–1.9)

## 2019-07-01 LAB — MAGNESIUM: Magnesium: 2.1 mg/dL (ref 1.7–2.4)

## 2019-07-01 MED ORDER — LEVETIRACETAM IN NACL 500 MG/100ML IV SOLN
500.0000 mg | Freq: Two times a day (BID) | INTRAVENOUS | Status: DC
  Administered 2019-07-01: 500 mg via INTRAVENOUS
  Filled 2019-07-01: qty 100

## 2019-07-01 MED ORDER — SODIUM CHLORIDE 0.9 % IV SOLN
1.0000 g | INTRAVENOUS | Status: DC
  Administered 2019-07-01 – 2019-07-02 (×2): 1 g via INTRAVENOUS
  Filled 2019-07-01 (×3): qty 10

## 2019-07-01 MED ORDER — THIAMINE HCL 100 MG/ML IJ SOLN
100.0000 mg | Freq: Once | INTRAMUSCULAR | Status: AC
  Administered 2019-07-01: 100 mg via INTRAVENOUS
  Filled 2019-07-01: qty 2

## 2019-07-01 MED ORDER — LEVETIRACETAM IN NACL 500 MG/100ML IV SOLN
500.0000 mg | Freq: Three times a day (TID) | INTRAVENOUS | Status: DC
  Administered 2019-07-01 – 2019-07-05 (×11): 500 mg via INTRAVENOUS
  Filled 2019-07-01 (×10): qty 100

## 2019-07-01 MED ORDER — ENOXAPARIN SODIUM 30 MG/0.3ML ~~LOC~~ SOLN
30.0000 mg | SUBCUTANEOUS | Status: DC
  Administered 2019-07-01: 30 mg via SUBCUTANEOUS
  Filled 2019-07-01: qty 0.3

## 2019-07-01 MED ORDER — SODIUM CHLORIDE 0.9 % IV BOLUS
1000.0000 mL | Freq: Once | INTRAVENOUS | Status: AC
  Administered 2019-07-01: 1000 mL via INTRAVENOUS

## 2019-07-01 MED ORDER — CHLORHEXIDINE GLUCONATE CLOTH 2 % EX PADS
6.0000 | MEDICATED_PAD | Freq: Every day | CUTANEOUS | Status: DC
  Administered 2019-07-03 – 2019-07-04 (×2): 6 via TOPICAL

## 2019-07-01 MED ORDER — SODIUM CHLORIDE 0.9 % IV BOLUS
500.0000 mL | Freq: Once | INTRAVENOUS | Status: AC
  Administered 2019-07-01: 500 mL via INTRAVENOUS

## 2019-07-01 MED ORDER — SODIUM CHLORIDE 0.9 % IV SOLN
2.0000 g | Freq: Once | INTRAVENOUS | Status: AC
  Administered 2019-07-01: 2 g via INTRAVENOUS
  Filled 2019-07-01: qty 20

## 2019-07-01 MED ORDER — LORAZEPAM 2 MG/ML IJ SOLN
0.5000 mg | Freq: Once | INTRAMUSCULAR | Status: AC
  Administered 2019-07-01: 0.5 mg via INTRAVENOUS
  Filled 2019-07-01: qty 1

## 2019-07-01 MED ORDER — SODIUM CHLORIDE 0.9 % IV SOLN
INTRAVENOUS | Status: DC
  Administered 2019-07-01 – 2019-07-02 (×3): via INTRAVENOUS

## 2019-07-01 MED ORDER — LORAZEPAM 2 MG/ML IJ SOLN: 2.0000 mg | INTRAMUSCULAR | Status: DC | PRN

## 2019-07-01 MED ORDER — LEVETIRACETAM IN NACL 1000 MG/100ML IV SOLN
1000.0000 mg | Freq: Once | INTRAVENOUS | Status: AC
  Administered 2019-07-01: 1000 mg via INTRAVENOUS
  Filled 2019-07-01: qty 100

## 2019-07-01 NOTE — Consult Note (Signed)
[facility] Ellesse Antenucci A. Gerilyn Pilgrim, MD     [email or web address]          Emma Crawford is an 85 y.o. female.   ASSESSMENT/PLAN: 1. Encephalopathy: Etiology is most likely multifactorial including UTI, pre renal azotemia and seizures. Supportive care as is currently being carried out. 2. Jerking of the upper extremities right more than that if suspicious for seizures: Maintenance dose of Keppra has been initiated.Consider repeat EEG and increasing the Keppra dose if not improved. 3. 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 Severe confluent chronic microvascular ischemic changes on MRI along with severe global atrophy: This increase the risk of vascular dementia long-term.     The patient 85 year old black female who presents with the acute onset unresponsiveness in jerking of the extremities. Workup has been significant for multiple metabolic derangements including increased creatinine, increased BUN, elevated alkaline phosphatase increased WBC and evidence of UTI. The patient was loaded on Keppra because of the jerking of the extremities. There is no previous history of seizures. It appears to be history of stroke in the past. Patient continues to be unresponsive in the hospital. Review of system is unobtainable given the marked encephalopathy.      GENERAL:  This is a thin female who appears to be in discomfort but no acute distress.  HEENT:  Neck is supple no trauma appreciated.  ABDOMEN: Soft  EXTREMITIES: No edema   BACK: Normal alignment.  SKIN: Normal by inspection.    MENTAL STATUS:  She lays in bed with eyes closed. There is no eye opening even to deep painful stimuli. She does follow both midline and appendicular commands with repeat stimulation however. She does this on both sides. This suggests possibility of apraxia of eyelid opening. There is minimal verbal output but she does moan and groans.  CRANIAL NERVES: Pupils are equal,  round and reactive to light; extraocular movements are full, there is no significant nystagmus; upper and lower facial muscles are normal in strength and symmetric, there is no flattening of the nasolabial folds; tongue is midline. She does seem to respond to direct threat bilaterally suggestive of intact visual fields. Corneal reflexes are intact.  MOTOR: She has antigravity strength in the upper extremities. She does withdrawal to pain in the legs bilaterally. There is near persistent jerking consistent with a twitching flexion movements of the right upper extremity. Infrequently the left upper extremity is also noted with some twitching of the left thigh at times. There is alternating increased tone of the upper extremities.  COORDINATION:  No bradykinesia, rigidity or parkinsonism is noted. Movements as described above on the motor exam.  REFLEXES: Deep tendon reflexes are symmetrical and normal. Plantar responses are flexor bilaterally.   SENSATION:  She responds to painful stimuli bilaterally.   Blood pressure (!) 143/88, pulse 89, temperature 98 F (36.7 C), temperature source Oral, resp. rate 18, height 5' (1.524 m), weight 52.6 kg, SpO2 98 %.  Past Medical History:  Diagnosis Date  . Hypertension   . Stroke [facility])     History reviewed. No pertinent surgical history.  No family history on file.  Social History:  reports that she has never smoked. She has quit using smokeless tobacco.  Her smokeless tobacco use included snuff. She reports previous alcohol use. She reports previous drug use.  Allergies:  Allergies  Allergen Reactions  . Penicillins Rash    Unknown Did it involve swelling of the face/tongue/throat, SOB, or low BP? No Did it  involve sudden or severe rash/hives, skin peeling, or any reaction on the inside of your mouth or nose? No Did you need to seek medical attention at a hospital or doctor's office? No When did it last happen?Childhood If all above  answers are "NO", may proceed with cephalosporin use.    Medications: Prior to Admission medications   Medication Sig Start Date End Date Taking? Authorizing Provider  aspirin EC 81 MG tablet Take 81 mg by mouth daily.   Yes [provider]  carvedilol (COREG) 6.25 MG tablet Take 6.25 mg by mouth 2 (two) times daily. 04/17/19  Yes [provider]  DERMA-SMOOTHE/FS BODY 0.01 % OIL Apply 1 application topically 3 (three) times daily. Apply a thin film to left arm and right leg three times a day 04/02/19  Yes [provider]  gabapentin (NEURONTIN) 100 MG capsule Take 100 mg by mouth 3 (three) times daily. 04/17/19  Yes [provider]  lidocaine (XYLOCAINE) 5 % ointment Apply 1 application topically 3 (three) times daily as needed. Apply topically to left shoulder three times a day as needed for pain 01/16/19  Yes [provider]  LINZESS 72 MCG capsule Take 72 mcg by mouth daily as needed. 03/16/19  Yes [provider]  lisinopril (ZESTRIL) 30 MG tablet Take 30 mg by mouth daily.   Yes [provider]  pravastatin (PRAVACHOL) 40 MG tablet Take 40 mg by mouth daily.    [provider]    Scheduled Meds: . enoxaparin (LOVENOX) injection  30 mg Subcutaneous Q24H   Continuous Infusions: . sodium chloride 75 mL/hr at 07/01/19 1432  . cefTRIAXone (ROCEPHIN)  IV Stopped (07/01/19 1218)  . levETIRAcetam Stopped (07/01/19 1247)   PRN Meds:.LORazepam     Results for orders placed or performed during the hospital encounter of 07/01/19 (from the past 48 hour(s))  Urine rapid drug screen (hosp performed)     Status: None   Collection Time: 07/01/19  3:45 AM  Result Value Ref Range   Opiates NONE DETECTED NONE DETECTED   Cocaine NONE DETECTED NONE DETECTED   Benzodiazepines NONE DETECTED NONE DETECTED   Amphetamines NONE DETECTED NONE DETECTED   Tetrahydrocannabinol NONE DETECTED NONE DETECTED   Barbiturates NONE DETECTED NONE  DETECTED    Comment: (NOTE) DRUG SCREEN FOR MEDICAL PURPOSES ONLY.  IF CONFIRMATION IS NEEDED FOR ANY PURPOSE, NOTIFY LAB WITHIN 5 DAYS. LOWEST DETECTABLE LIMITS FOR URINE DRUG SCREEN Drug Class                     Cutoff (ng/mL) Amphetamine and metabolites    1000 Barbiturate and metabolites    200 Benzodiazepine                 200 Tricyclics and metabolites     300 Opiates and metabolites        300 Cocaine and metabolites        300 THC                            50 Performed at [facility], [address], [place], [place] [address]   Urinalysis, Routine w reflex microscopic     Status: Abnormal   Collection Time: 07/01/19  3:45 AM  Result Value Ref Range   Color, Urine AMBER (A) YELLOW    Comment: BIOCHEMICALS MAY BE AFFECTED BY COLOR   APPearance TURBID (A) CLEAR   Specific Gravity, Urine 1.017  1.005 - 1.030   pH 6.0 5.0 - 8.0   Glucose, UA NEGATIVE NEGATIVE mg/dL   Hgb urine dipstick MODERATE (A) NEGATIVE   Bilirubin Urine SMALL (A) NEGATIVE   Ketones, ur 5 (A) NEGATIVE mg/dL   Protein, ur 409 (A) NEGATIVE mg/dL   Nitrite NEGATIVE NEGATIVE   Leukocytes,Ua TRACE (A) NEGATIVE   RBC / HPF >50 (H) 0 - 5 RBC/hpf   WBC, UA >50 (H) 0 - 5 WBC/hpf   Bacteria, UA MANY (A) NONE SEEN   Squamous Epithelial / LPF 6-10 0 - 5   WBC Clumps PRESENT    Mucus PRESENT    Budding Yeast PRESENT    Ca Oxalate Crys, UA PRESENT    Non Squamous Epithelial 6-10 (A) NONE SEEN    Comment: Performed at [facility], [address], [place], [place] [address]  Magnesium     Status: None   Collection Time: 07/01/19  3:45 AM  Result Value Ref Range   Magnesium 2.1 1.7 - 2.4 mg/dL    Comment: Performed at [facility] [address], [place], [place] [address]  Ethanol     Status: None   Collection Time: 07/01/19  3:50 AM  Result Value Ref Range   Alcohol, Ethyl (B) <10 <10 mg/dL    Comment: (NOTE) Lowest detectable limit for serum alcohol is 10 mg/dL. For medical purposes only. Performed  at [facility], [address] [place], [place] [address]   Protime-INR     Status: None   Collection Time: 07/01/19  3:50 AM  Result Value Ref Range   Prothrombin Time 14.9 11.4 - 15.2 seconds   INR 1.2 0.8 - 1.2    Comment: (NOTE) INR goal varies based on device and disease states. Performed at [facility], [address] [place], [place] [address]   APTT     Status: None   Collection Time: 07/01/19  3:50 AM  Result Value Ref Range   aPTT 35 24 - 36 seconds    Comment: Performed at [facility], [address], [place], [place] [address]  CBC     Status: Abnormal   Collection Time: 07/01/19  3:50 AM  Result Value Ref Range   WBC 13.2 (H) 4.0 - 10.5 K/uL   RBC 4.86 3.87 - 5.11 MIL/uL   Hemoglobin 13.1 12.0 - 15.0 g/dL   HCT 29.5 28.4 - 13.2 %   MCV 87.4 80.0 - 100.0 fL   MCH 27.0 26.0 - 34.0 pg   MCHC 30.8 30.0 - 36.0 g/dL   RDW 44.0 10.2 - 72.5 %   Platelets 235 150 - 400 K/uL   nRBC 0.0 0.0 - 0.2 %    Comment: Performed at [facility], [address], [place], [place] [address]  Differential     Status: Abnormal   Collection Time: 07/01/19  3:50 AM  Result Value Ref Range   Neutrophils Relative % 78 %   Neutro Abs 10.4 (H) 1.7 - 7.7 K/uL   Lymphocytes Relative 13 %   Lymphs Abs 1.7 0.7 - 4.0 K/uL   Monocytes Relative 8 %   Monocytes Absolute 1.0 0.1 - 1.0 K/uL   Eosinophils Relative 0 %   Eosinophils Absolute 0.0 0.0 - 0.5 K/uL   Basophils Relative 0 %   Basophils Absolute 0.0 0.0 - 0.1 K/uL   Immature Granulocytes 1 %   Abs Immature Granulocytes 0.07 0.00 - 0.07 K/uL    Comment: Performed at [facility], [address] [place], [place] [address]  Comprehensive metabolic panel     Status: Abnormal   Collection Time: 07/01/19  3:50 AM  Result Value Ref Range   Sodium 143 135 - 145 mmol/L   Potassium 4.3 3.5 - 5.1 mmol/L   Chloride 111 98 - 111 mmol/L   CO2 22 22 - 32 mmol/L   Glucose, Bld 136 (H) 70 - 99 mg/dL   BUN 45 (H) 8 - 23 mg/dL   Creatinine, Ser 4.40  (H) 0.44 - 1.00 mg/dL   Calcium 8.7 (L) 8.9 - 10.3 mg/dL   Total Protein 7.2 6.5 - 8.1 g/dL   Albumin 3.2 (L) 3.5 - 5.0 g/dL   AST 19 15 - 41 U/L   ALT 12 0 - 44 U/L   Alkaline Phosphatase 258 (H) 38 - 126 U/L   Total Bilirubin 1.2 0.3 - 1.2 mg/dL   GFR calc non Af Amer 41 (L) >60 mL/min   GFR calc Af Amer 47 (L) >60 mL/min   Anion gap 10 5 - 15    Comment: Performed at [facility], [address] [place], [place] [address]  T4, free     Status: Abnormal   Collection Time: 07/01/19  3:50 AM  Result Value Ref Range   Free T4 1.65 (H) 0.61 - 1.12 ng/dL    Comment: (NOTE) Biotin ingestion may interfere with free T4 tests. If the results are inconsistent with the TSH level, previous test results, or the clinical presentation, then consider biotin interference. If needed, order repeat testing after stopping biotin. Performed at [facility], [address] [place], [place] [address]   I-stat chem 8, ED     Status: Abnormal   Collection Time: 07/01/19  3:54 AM  Result Value Ref Range   Sodium 144 135 - 145 mmol/L   Potassium 4.3 3.5 - 5.1 mmol/L   Chloride 109 98 - 111 mmol/L   BUN 41 (H) 8 - 23 mg/dL   Creatinine, Ser 4.03 (H) 0.44 - 1.00 mg/dL   Glucose, Bld 474 (H) 70 - 99 mg/dL   Calcium, Ion 2.59 5.63 - 1.40 mmol/L   TCO2 22 22 - 32 mmol/L   Hemoglobin 13.9 12.0 - 15.0 g/dL   HCT 87.5 64.3 - 32.9 %  Lactic acid, plasma     Status: None   Collection Time: 07/01/19  4:55 AM  Result Value Ref Range   Lactic Acid, Venous 0.8 0.5 - 1.9 mmol/L    Comment: Performed at [facility], [address], [place], [place] [address]  Culture, blood (routine x 2)     Status: None (Preliminary result)   Collection Time: 07/01/19  4:55 AM   Specimen: BLOOD  Result Value Ref Range   Specimen Description BLOOD RIGHT IJ DRAWN BY RN    Special Requests      BOTTLES DRAWN AEROBIC AND ANAEROBIC Blood Culture adequate volume   Culture      NO GROWTH < 12 HOURS Performed at [facility], [address], [place], [place] [address]    Report Status PENDING   SARS Coronavirus 2 by RT PCR (hospital order, performed in [facility] hospital lab) Nasopharyngeal Nasopharyngeal Swab     Status: None   Collection Time: 07/01/19  6:11 AM   Specimen: Nasopharyngeal Swab  Result Value Ref Range   SARS Coronavirus 2 NEGATIVE NEGATIVE    Comment: (NOTE) If result is NEGATIVE SARS-CoV-2 target nucleic acids are NOT DETECTED. The SARS-CoV-2 RNA  is generally detectable in upper and lower  respiratory specimens during the acute phase of infection. The lowest  concentration of SARS-CoV-2 viral copies this assay can detect is 250  copies / mL. A negative result does not preclude SARS-CoV-2 infection  and should not be used as the sole basis for treatment or other  patient management decisions.  A negative result may occur with  improper specimen collection / handling, submission of specimen other  than nasopharyngeal swab, presence of viral mutation(s) within the  areas targeted by this assay, and inadequate number of viral copies  (<250 copies / mL). A negative result must be combined with clinical  observations, patient history, and epidemiological information. If result is POSITIVE SARS-CoV-2 target nucleic acids are DETECTED. The SARS-CoV-2 RNA is generally detectable in upper and lower  respiratory specimens dur ing the acute phase of infection.  Positive  results are indicative of active infection with SARS-CoV-2.  Clinical  correlation with patient history and other diagnostic information is  necessary to determine patient infection status.  Positive results do  not rule out bacterial infection or co-infection with other viruses. If result is PRESUMPTIVE POSTIVE SARS-CoV-2 nucleic acids MAY BE PRESENT.   A presumptive positive result was obtained on the submitted specimen  and confirmed on repeat testing.  While 2019 novel coronavirus  (SARS-CoV-2) nucleic acids may be present  in the submitted sample  additional confirmatory testing may be necessary for epidemiological  and / or clinical management purposes  to differentiate between  SARS-CoV-2 and other Sarbecovirus currently known to infect humans.  If clinically indicated additional testing with an alternate test  methodology [phone]) is advised. The SARS-CoV-2 RNA is generally  detectable in upper and lower respiratory sp ecimens during the acute  phase of infection. The expected result is Negative. Fact Sheet for Patients:  StrictlyIdeas.no Fact Sheet for Healthcare Providers: BankingDealers.co.za This test is not yet approved or cleared by the [place] FDA and has been authorized for detection and/or diagnosis of SARS-CoV-2 by FDA under an Emergency Use Authorization (EUA).  This EUA will remain in effect (meaning this test can be used) for the duration of the COVID-19 declaration under Section 564(b)(1) of the Act, 21 U.S.C. section 360bbb-3(b)(1), unless the authorization is terminated or revoked sooner. Performed at [facility], [address], [place], [place] [address]   TSH     Status: Abnormal   Collection Time: 07/01/19  6:30 AM  Result Value Ref Range   TSH 0.130 (L) 0.350 - 4.500 uIU/mL    Comment: Performed by a 3rd Generation assay with a functional sensitivity of <=0.01 uIU/mL. Performed at [facility], [address], [place], [place] [address]   Culture, blood (routine x 2)     Status: None (Preliminary result)   Collection Time: 07/01/19  7:55 AM   Specimen: BLOOD  Result Value Ref Range   Specimen Description BLOOD NECK RIGHT DRAWN BY RN    Special Requests      BOTTLES DRAWN AEROBIC AND ANAEROBIC Blood Culture adequate volume Performed at [facility], [address], [place], [place] [address]    Culture PENDING    Report Status PENDING   MRSA PCR Screening     Status: None   Collection Time: 07/01/19 11:07 AM   Specimen:  Nasal Mucosa; Nasopharyngeal  Result Value Ref Range   MRSA by PCR NEGATIVE NEGATIVE    Comment:        The GeneXpert MRSA Assay (FDA approved for  NASAL specimens only), is one component of a comprehensive MRSA colonization surveillance program. It is not intended to diagnose MRSA infection nor to guide or monitor treatment for MRSA infections. Performed at [facility], [address], [place], [place] [address]   Blood gas, arterial     Status: Abnormal   Collection Time: 07/01/19 11:30 AM  Result Value Ref Range   FIO2 21.00    pH, Arterial 7.389 7.350 - 7.450   pCO2 arterial 37.0 32.0 - 48.0 mmHg   pO2, Arterial 65.0 (L) 83.0 - 108.0 mmHg   Bicarbonate 22.5 20.0 - 28.0 mmol/L   Acid-base deficit 2.3 (H) 0.0 - 2.0 mmol/L   O2 Saturation 91.3 %   Patient temperature 37.0    Allens test (pass/fail) BRACHIAL ARTERY (A) PASS    Comment: Performed at [facility], [address] [place], [place] [address]    Studies/Results:   EEG DESCRIPTION: EEG showed continuous generalized 4-6hz  theta-delta slowing. No clear posterior dominant rhythm was seen. Hyperventilation and photic stimulation were not performed.  Of note, study was technically limited due to significant myogenic artifact.  ABNORMALITY - Continuous slow, generalized  IMPRESSION: This technically difficult study is suggestive of moderate diffuse encephalopathy, non specific to etiology. No seizures or epileptiform discharges were seen throughout the recording.    HEAD CT FINDINGS: Brain: Perisylvian cerebral volume loss. No midline shift, mass effect, or evidence of intracranial mass lesion. No ventriculomegaly. No acute intracranial hemorrhage identified.  Widespread and confluent bilateral cerebral white matter hypodensity. Heterogeneous hypodensity in the bilateral deep gray nuclei, especially the thalami. Brainstem and cerebellum appear within normal limits.  No cortically based acute infarct  identified.  Vascular: Calcified atherosclerosis at the skull base. No suspicious intracranial vascular hyperdensity.  Skull: No acute osseous abnormality identified.  Sinuses/Orbits: Right maxillary sinus opacified with periosteal thickening. Right frontal sinus mucosal thickening and osteoma, but other paranasal sinuses are well pneumatized. Tympanic cavities and mastoids are clear.  Other: Visualized orbits and scalp soft tissues are within normal limits.  IMPRESSION: 1. No acute cortically based infarct or acute intracranial hemorrhage identified. 2. Advanced small vessel disease with no prior study for comparison. 3. Chronic right maxillary sinusitis.      BRAIN MRI FINDINGS: Brain:  Multiple sequences are motion degraded. Most notably, there is moderate motion degradation of the sagittal T1 weighted imaging.  The diffusion-weighted imaging is of good quality. No convincing evidence of acute infarct.  No evidence of intracranial mass.  No midline shift or extra-axial fluid collection.  No chronic intracranial blood products.  Advanced chronic small vessel ischemic disease within the cerebral white matter, also involving the bilateral basal ganglia and thalami.  Small focus of chronic hemorrhage within the right thalamus.  Moderate generalized parenchymal atrophy.  Vascular: Flow voids maintained within the proximal large arterial vessels.  Skull and upper cervical spine: No focal marrow lesion identified on motion degraded imaging.  Sinuses/Orbits: Visualized orbits demonstrate no acute abnormality. Complete opacification of the right maxillary sinus. Mucosal thickening also present within the right frontal and ethmoid sinuses.  IMPRESSION: 1. Intermittently motion degraded examination. 2. No evidence of acute intracranial abnormality, including acute infarct. 3. No seizure focus is definitively identified, although a dedicated  seizure protocol was not performed. 4. Moderate generalized parenchymal atrophy with advanced chronic small vessel ischemic disease.  5. Chronic right maxillary sinusitis.      The brain MRI is reviewed in person. No acute findings are noted on DWI. No hemorrhages appreciated. 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 There is also severe global atrophy. There are few small reduced signal  T1 involving the thalamic regions and the basal ganglia suggestive infarcts.   Rahsaan Weakland A. Gerilyn Pilgrimoonquah, M.D.  Diplomate, Biomedical engineerAmerican Board of Psychiatry and Neurology ( Neurology). 07/01/2019, 6:40 PM

## 2019-07-01 NOTE — H&P (Signed)
TRH H&P    Patient Demographics:    Emma Crawford, is a 85 y.o. female  MRN: [id number]  DOB - [birth year]  Admit Date - 07/01/2019  Referring MD/NP/PA: Dr. Oletta Cohn   Outpatient Primary MD for the patient is Eliezer Lofts, MD  Patient coming from: Home  Chief complaint- Unresponsive   HPI:    Emma Crawford  is a 85 y.o. female, w h/o HTN, HL, on aspirin, previous stroke w L hemiparesis - no movement L arm, decreased movement L leg, not standing since hospitalization 1 mo ago p/w altered mental status 11/22, pt w episodes of abnormal movements.  Patient lives at home with daughter who helps to take care of her.  Daughter went to give her her 8pm medications and found patient hard to arouse.  She monitored patient throughout the night, and then when patient started having abnormal body movements around 3 - 4 in the morning they came into the ER.  Patient does not have a history of seizures.  Patient is sedated, possibly postictal, is not able to provide history.  No family at bedside.  In the ER Neuro was consulted the recommended Ativan 1 mg, loading dose of Keppra, continuing Keppra at 500 twice daily, EEG, MR brain with and without contrast, seizure precautions, neuro checks.  ED provider reports that patient has been nonverbal and difficult to arouse for the entire time in the ED.  He reports that there was some rhythmic movement of her shoulder, and lipsmacking but that was all resolved with Ativan.  T head without contrast showed 1 no acute cortically based infarct or acute intracranial hemorrhage identified.  2 advanced small vessel disease with no prior study for comparison.  3 chronic right maxillary sinusitis.  UDS was done that had nothing detected.  Alcohol level was less than 10.  UA was suspicious for UTI with greater than 50 white blood cells trace leukocytes and turbid amber results.  Glucose is 131.   Otology revealed a leukocytosis of 13.2.  Chemistry showed CKD stage III with a GFR of 47, creatinine 1.23, BUN 40s, electrolytes normal.  Admission was requested for continued work-up of altered mental status that is thought to be secondary to new onset seizure.   Review of systems:    Review of systems could not be obtained due to patient's altered mental status.    Past History of the following :    Past Medical History:  Diagnosis Date  . Hypertension   . Stroke [facility])       History reviewed. No pertinent surgical history.    Social History:      Social History   Tobacco Use  . Smoking status: Never Smoker  . Smokeless tobacco: Former Neurosurgeon    Types: Snuff  Substance Use Topics  . Alcohol use: Not Currently       Family History :    No family history on file. Family history could not be reviewed as patient is unresponsive.    Home Medications:   Prior to Admission medications  Medication Sig Start Date End Date Taking? Authorizing Provider  aspirin EC 81 MG tablet Take 81 mg by mouth daily.    [provider]  carvedilol (COREG) 6.25 MG tablet Take 6.25 mg by mouth 2 (two) times daily. 04/17/19   [provider]  DERMA-SMOOTHE/FS BODY 0.01 % OIL Apply 1 application topically 3 (three) times daily. Apply a thin film to left arm and right leg three times a day 04/02/19   [provider]  gabapentin (NEURONTIN) 100 MG capsule Take 100 mg by mouth 3 (three) times daily. 04/17/19   [provider]  lidocaine (XYLOCAINE) 5 % ointment Apply 1 application topically 3 (three) times daily as needed. Apply topically to left shoulder three times a day as needed for pain 01/16/19   [provider]  LINZESS 72 MCG capsule Take 72 mcg by mouth daily as needed. 03/16/19   [provider]  lisinopril (ZESTRIL) 30 MG tablet Take 30 mg by mouth daily.    [provider]  pravastatin (PRAVACHOL) 40 MG tablet Take 40 mg by mouth  daily.    [provider]  Vitamin D, Ergocalciferol, (DRISDOL) 1.25 MG (50000 UT) CAPS capsule Take 50,000 Units by mouth once a week. 02/07/19   [provider]     Allergies:     Allergies  Allergen Reactions  . Penicillins Rash    Unknown Did it involve swelling of the face/tongue/throat, SOB, or low BP? No Did it involve sudden or severe rash/hives, skin peeling, or any reaction on the inside of your mouth or nose? No Did you need to seek medical attention at a hospital or doctor's office? No When did it last happen?Childhood If all above answers are "NO", may proceed with cephalosporin use.     Physical Exam:   Vitals  Blood pressure (!) 101/53, pulse 80, temperature 99.1 F (37.3 C), temperature source Rectal, resp. rate (!) 22, height 5' (1.524 m), weight 59 kg, SpO2 95 %.  1.  General: Patient is lying supine in bed unresponsive  2. Psychiatric: Could not be tested  3. Neurologic: Unresponsive, protecting airway, withdraws from pain  4. HEENMT:  Head is atraumatic normocephalic with poor dentition  5. Respiratory : Lungs are clear to auscultation bilaterally  6. Cardiovascular : Heart rate and rhythm are regular  7. Gastrointestinal:  Abdomen is soft nondistended patient does not grimace with palpation  8. Skin:  Skin is dry and hyperpigmented in distal extremities  9.Musculoskeletal:  No peripheral edema    Data Review:    CBC Recent Labs  Lab 07/01/19 0350 07/01/19 0354  WBC 13.2*  --   HGB 13.1 13.9  HCT 42.5 41.0  PLT 235  --   MCV 87.4  --   MCH 27.0  --   MCHC 30.8  --   RDW 13.4  --   LYMPHSABS 1.7  --   MONOABS 1.0  --   EOSABS 0.0  --   BASOSABS 0.0  --    ------------------------------------------------------------------------------------------------------------------  Results for orders placed or performed during the hospital encounter of 07/01/19 (from the past 48 hour(s))  Urine rapid drug screen  (hosp performed)     Status: None   Collection Time: 07/01/19  3:45 AM  Result Value Ref Range   Opiates NONE DETECTED NONE DETECTED   Cocaine NONE DETECTED NONE DETECTED   Benzodiazepines NONE DETECTED NONE DETECTED   Amphetamines NONE DETECTED NONE DETECTED   Tetrahydrocannabinol NONE DETECTED NONE DETECTED  Barbiturates NONE DETECTED NONE DETECTED    Comment: (NOTE) DRUG SCREEN FOR MEDICAL PURPOSES ONLY.  IF CONFIRMATION IS NEEDED FOR ANY PURPOSE, NOTIFY LAB WITHIN 5 DAYS. LOWEST DETECTABLE LIMITS FOR URINE DRUG SCREEN Drug Class                     Cutoff (ng/mL) Amphetamine and metabolites    1000 Barbiturate and metabolites    200 Benzodiazepine                 200 Tricyclics and metabolites     300 Opiates and metabolites        300 Cocaine and metabolites        300 THC                            50 Performed at [facility], [address], [place], [place] [address]   Urinalysis, Routine w reflex microscopic     Status: Abnormal   Collection Time: 07/01/19  3:45 AM  Result Value Ref Range   Color, Urine AMBER (A) YELLOW    Comment: BIOCHEMICALS MAY BE AFFECTED BY COLOR   APPearance TURBID (A) CLEAR   Specific Gravity, Urine 1.017 1.005 - 1.030   pH 6.0 5.0 - 8.0   Glucose, UA NEGATIVE NEGATIVE mg/dL   Hgb urine dipstick MODERATE (A) NEGATIVE   Bilirubin Urine SMALL (A) NEGATIVE   Ketones, ur 5 (A) NEGATIVE mg/dL   Protein, ur 191 (A) NEGATIVE mg/dL   Nitrite NEGATIVE NEGATIVE   Leukocytes,Ua TRACE (A) NEGATIVE   RBC / HPF >50 (H) 0 - 5 RBC/hpf   WBC, UA >50 (H) 0 - 5 WBC/hpf   Bacteria, UA MANY (A) NONE SEEN   Squamous Epithelial / LPF 6-10 0 - 5   WBC Clumps PRESENT    Mucus PRESENT    Budding Yeast PRESENT    Ca Oxalate Crys, UA PRESENT    Non Squamous Epithelial 6-10 (A) NONE SEEN    Comment: Performed at [facility], [address] [place], [place] [address]  Ethanol     Status: None   Collection Time: 07/01/19  3:50 AM  Result Value Ref Range    Alcohol, Ethyl (B) <10 <10 mg/dL    Comment: (NOTE) Lowest detectable limit for serum alcohol is 10 mg/dL. For medical purposes only. Performed at [facility], [address] [place], [place] [address]   Protime-INR     Status: None   Collection Time: 07/01/19  3:50 AM  Result Value Ref Range   Prothrombin Time 14.9 11.4 - 15.2 seconds   INR 1.2 0.8 - 1.2    Comment: (NOTE) INR goal varies based on device and disease states. Performed at [facility], [address], [place], [place] [address]   APTT     Status: None   Collection Time: 07/01/19  3:50 AM  Result Value Ref Range   aPTT 35 24 - 36 seconds    Comment: Performed at [facility], [address], [place], [place] [address]  CBC     Status: Abnormal   Collection Time: 07/01/19  3:50 AM  Result Value Ref Range   WBC 13.2 (H) 4.0 - 10.5 K/uL   RBC 4.86 3.87 - 5.11 MIL/uL   Hemoglobin 13.1 12.0 - 15.0 g/dL   HCT 29.5 28.4 - 13.2 %   MCV 87.4 80.0 - 100.0 fL   MCH 27.0 26.0 - 34.0  pg   MCHC 30.8 30.0 - 36.0 g/dL   RDW 13.4 11.5 - 15.5 %   Platelets 235 150 - 400 K/uL   nRBC 0.0 0.0 - 0.2 %    Comment: Performed at [facility], [address], [place], [place] [address]  Differential     Status: Abnormal   Collection Time: 07/01/19  3:50 AM  Result Value Ref Range   Neutrophils Relative % 78 %   Neutro Abs 10.4 (H) 1.7 - 7.7 K/uL   Lymphocytes Relative 13 %   Lymphs Abs 1.7 0.7 - 4.0 K/uL   Monocytes Relative 8 %   Monocytes Absolute 1.0 0.1 - 1.0 K/uL   Eosinophils Relative 0 %   Eosinophils Absolute 0.0 0.0 - 0.5 K/uL   Basophils Relative 0 %   Basophils Absolute 0.0 0.0 - 0.1 K/uL   Immature Granulocytes 1 %   Abs Immature Granulocytes 0.07 0.00 - 0.07 K/uL    Comment: Performed at [facility], [address], [place], [place] [address]  Comprehensive metabolic panel     Status: Abnormal   Collection Time: 07/01/19  3:50 AM  Result Value Ref Range   Sodium 143 135 - 145 mmol/L   Potassium 4.3 3.5 - 5.1  mmol/L   Chloride 111 98 - 111 mmol/L   CO2 22 22 - 32 mmol/L   Glucose, Bld 136 (H) 70 - 99 mg/dL   BUN 45 (H) 8 - 23 mg/dL   Creatinine, Ser 1.23 (H) 0.44 - 1.00 mg/dL   Calcium 8.7 (L) 8.9 - 10.3 mg/dL   Total Protein 7.2 6.5 - 8.1 g/dL   Albumin 3.2 (L) 3.5 - 5.0 g/dL   AST 19 15 - 41 U/L   ALT 12 0 - 44 U/L   Alkaline Phosphatase 258 (H) 38 - 126 U/L   Total Bilirubin 1.2 0.3 - 1.2 mg/dL   GFR calc non Af Amer 41 (L) >60 mL/min   GFR calc Af Amer 47 (L) >60 mL/min   Anion gap 10 5 - 15    Comment: Performed at [facility], [address] [place], [place] [address]  I-stat chem 8, ED     Status: Abnormal   Collection Time: 07/01/19  3:54 AM  Result Value Ref Range   Sodium 144 135 - 145 mmol/L   Potassium 4.3 3.5 - 5.1 mmol/L   Chloride 109 98 - 111 mmol/L   BUN 41 (H) 8 - 23 mg/dL   Creatinine, Ser 1.20 (H) 0.44 - 1.00 mg/dL   Glucose, Bld 131 (H) 70 - 99 mg/dL   Calcium, Ion 1.19 1.15 - 1.40 mmol/L   TCO2 22 22 - 32 mmol/L   Hemoglobin 13.9 12.0 - 15.0 g/dL   HCT 41.0 36.0 - 46.0 %    Chemistries  Recent Labs  Lab 07/01/19 0350 07/01/19 0354  NA 143 144  K 4.3 4.3  CL 111 109  CO2 22  --   GLUCOSE 136* 131*  BUN 45* 41*  CREATININE 1.23* 1.20*  CALCIUM 8.7*  --   AST 19  --   ALT 12  --   ALKPHOS 258*  --   BILITOT 1.2  --    ------------------------------------------------------------------------------------------------------------------  ------------------------------------------------------------------------------------------------------------------ GFR: Estimated Creatinine Clearance: 28.5 mL/min (A) (by C-G formula based on SCr of 1.2 mg/dL (H)). Liver Function Tests: Recent Labs  Lab 07/01/19 0350  AST 19  ALT 12  ALKPHOS 258*  BILITOT 1.2  PROT 7.2  ALBUMIN 3.2*  No results for input(s): LIPASE, AMYLASE in the last 168 hours. No results for input(s): AMMONIA in the last 168 hours. Coagulation Profile: Recent Labs  Lab 07/01/19 0350   INR 1.2   Cardiac Enzymes: No results for input(s): CKTOTAL, CKMB, CKMBINDEX, TROPONINI in the last 168 hours. BNP (last 3 results) No results for input(s): PROBNP in the last 8760 hours. HbA1C: No results for input(s): HGBA1C in the last 72 hours. CBG: No results for input(s): GLUCAP in the last 168 hours. Lipid Profile: No results for input(s): CHOL, HDL, LDLCALC, TRIG, CHOLHDL, LDLDIRECT in the last 72 hours. Thyroid Function Tests: No results for input(s): TSH, T4TOTAL, FREET4, T3FREE, THYROIDAB in the last 72 hours. Anemia Panel: No results for input(s): VITAMINB12, FOLATE, FERRITIN, TIBC, IRON, RETICCTPCT in the last 72 hours.  --------------------------------------------------------------------------------------------------------------- Urine analysis:    Component Value Date/Time   COLORURINE AMBER (A) 07/01/2019 0345   APPEARANCEUR TURBID (A) 07/01/2019 0345   LABSPEC 1.017 07/01/2019 0345   PHURINE 6.0 07/01/2019 0345   GLUCOSEU NEGATIVE 07/01/2019 0345   HGBUR MODERATE (A) 07/01/2019 0345   BILIRUBINUR SMALL (A) 07/01/2019 0345   KETONESUR 5 (A) 07/01/2019 0345   PROTEINUR 100 (A) 07/01/2019 0345   NITRITE NEGATIVE 07/01/2019 0345   LEUKOCYTESUR TRACE (A) 07/01/2019 0345      Imaging Results:    Ct Head Wo Contrast  Result Date: 07/01/2019 CLINICAL DATA:  85 year old female with unexplained altered mental status. Found unresponsive. EXAM: CT HEAD WITHOUT CONTRAST TECHNIQUE: Contiguous axial images were obtained from the base of the skull through the vertex without intravenous contrast. COMPARISON:  None. FINDINGS: Brain: Perisylvian cerebral volume loss. No midline shift, mass effect, or evidence of intracranial mass lesion. No ventriculomegaly. No acute intracranial hemorrhage identified. Widespread and confluent bilateral cerebral white matter hypodensity. Heterogeneous hypodensity in the bilateral deep gray nuclei, especially the thalami. Brainstem and  cerebellum appear within normal limits. No cortically based acute infarct identified. Vascular: Calcified atherosclerosis at the skull base. No suspicious intracranial vascular hyperdensity. Skull: No acute osseous abnormality identified. Sinuses/Orbits: Right maxillary sinus opacified with periosteal thickening. Right frontal sinus mucosal thickening and osteoma, but other paranasal sinuses are well pneumatized. Tympanic cavities and mastoids are clear. Other: Visualized orbits and scalp soft tissues are within normal limits. IMPRESSION: 1. No acute cortically based infarct or acute intracranial hemorrhage identified. 2. Advanced small vessel disease with no prior study for comparison. 3. Chronic right maxillary sinusitis. Electronically Signed   By: Odessa Fleming M.D.   On: 07/01/2019 04:18    My personal review of EKG: Rhythm NSR, Rate 88 /min, QTc 465 ,no Acute ST changes   Assessment & Plan:    Active Problems:   * No active hospital problems. *   1. Acute metabolic encephalopathy 1. Likely Post ictal 2. Neuro consulted from ED 3. UDS negative  4. CT head without acute abnormalityy 5. EtOH <10 6. Withdrawal? No known history of dependence 7. Thiamine deficiency? One time dose ordered 8. Infectious - Family refusing Lumbar puncture at this time - conservative therapy as mother has been through enough 9. TSH to r/o thyroid derangement as etiology 10. Continue to monitor 2. Seizure-like activity 1. Ativan in ED aborted abnormal movements 2. EEG ordered 3. MR Brain ordered 4. Seizure precautions 5. Neuro consulted from ED 3. Sepsis 2/2 UTI 1. Continue Rocephin 2. Leukocytosis, Urine WBCs, turbid, amber urine 3. Urine culture sent 4. Blood cultures ordered 5. Lactic acid ordered.  6. BP 107/66, P 93, R  15, WBC 13 7. 1230ml/kg bolus = 1.5L - bolus ordered 4. Hypotension 1. Central line placed in preparation for possible pressors 2. 500ml fluid bolus in ED 3. Add another fluid bolus of  1L 4. Continue fluids at 125ml after that 5. History of stroke 1. Holding home meds as patient is not alert enough to take PO medication 6. Other chronic conditions 1. Hold home meds, patient is not alert enough to take them   DVT Prophylaxis-   Lovenox - SCDs   AM Labs Ordered, also please review Full Orders  Family Communication: No family at bedside Code Status:  DNR, DNI  Admission status:Inpatient: Based on patients clinical presentation and evaluation of above clinical data, I have made determination that patient meets Inpatient criteria at this time.  Time spent in minutes : 65   Lilyan GilfordAsia B Zierle-Ghosh M.D on 07/01/2019 at 5:33 AM

## 2019-07-01 NOTE — ED Notes (Signed)
Pt's O2 90% on RA, this RN placed pt on 2LPM via [place]

## 2019-07-01 NOTE — Progress Notes (Signed)
Unable to perform a full neuro assessment at this time due to pt only being responsive to pain and unable to follow commands. GCS of 6 currently.

## 2019-07-01 NOTE — ED Provider Notes (Signed)
[facility] EMERGENCY DEPARTMENT Provider Note   CSN: [id number] Arrival date & time: 07/01/19  7408     History   Chief Complaint Chief Complaint  Patient presents with   Unresponsive    HPI Emma Crawford is a 85 y.o. female.     Patient presents to the emergency department for evaluation of unresponsiveness.  Patient brought by daughter who who is her primary caregiver.  Daughter reports that the patient generalized days in her bed 24 hours a day.  She was in her normal state of health earlier today, laughing and joking with family and behaving normally.  Daughter checked on her around 5 or 6 and she was fine.  She went to give her medications at 8 PM and was unable to wake her.     Past Medical History:  Diagnosis Date   Hypertension    Stroke [facility])     Patient Active Problem List   Diagnosis Date Noted   New onset seizure ([place]) 07/01/2019    History reviewed. No pertinent surgical history.   OB History   No obstetric history on file.      Home Medications    Prior to Admission medications   Medication Sig Start Date End Date Taking? Authorizing Provider  aspirin EC 81 MG tablet Take 81 mg by mouth daily.    [provider]  carvedilol (COREG) 6.25 MG tablet Take 6.25 mg by mouth 2 (two) times daily. 04/17/19   [provider]  DERMA-SMOOTHE/FS BODY 0.01 % OIL Apply 1 application topically 3 (three) times daily. Apply a thin film to left arm and right leg three times a day 04/02/19   [provider]  gabapentin (NEURONTIN) 100 MG capsule Take 100 mg by mouth 3 (three) times daily. 04/17/19   [provider]  lidocaine (XYLOCAINE) 5 % ointment Apply 1 application topically 3 (three) times daily as needed. Apply topically to left shoulder three times a day as needed for pain 01/16/19   [provider]  LINZESS 72 MCG capsule Take 72 mcg by mouth daily as needed. 03/16/19   [provider]  lisinopril  (ZESTRIL) 30 MG tablet Take 30 mg by mouth daily.    [provider]  pravastatin (PRAVACHOL) 40 MG tablet Take 40 mg by mouth daily.    [provider]  Vitamin D, Ergocalciferol, (DRISDOL) 1.25 MG (50000 UT) CAPS capsule Take 50,000 Units by mouth once a week. 02/07/19   [provider]    Family History No family history on file.  Social History Social History   Tobacco Use   Smoking status: Never Smoker   Smokeless tobacco: Former Systems developer    Types: Snuff  Substance Use Topics   Alcohol use: Not Currently   Drug use: Not Currently     Allergies   Penicillins   Review of Systems Review of Systems  Unable to perform ROS: Mental status change     Physical Exam Updated Vital Signs BP 107/66    Pulse 93    Temp 99.1 F (37.3 C) (Rectal)    Resp 15    Ht 5' (1.524 m)    Wt 59 kg    SpO2 100%    BMI 25.39 kg/m   Physical Exam Vitals signs and nursing note reviewed.  Constitutional:      General: She is not in acute distress.    Appearance: Normal appearance. She is well-developed.  HENT:     Head: Normocephalic and  atraumatic.     Right Ear: Hearing normal.     Left Ear: Hearing normal.     Nose: Nose normal.  Eyes:     Conjunctiva/sclera: Conjunctivae normal.     Pupils: Pupils are equal, round, and reactive to light.  Neck:     Musculoskeletal: Normal range of motion and neck supple.  Cardiovascular:     Rate and Rhythm: Regular rhythm.     Heart sounds: S1 normal and S2 normal. No murmur. No friction rub. No gallop.   Pulmonary:     Effort: Pulmonary effort is normal. No respiratory distress.     Breath sounds: Normal breath sounds.  Chest:     Chest wall: No tenderness.  Abdominal:     General: Bowel sounds are normal.     Palpations: Abdomen is soft.     Tenderness: There is no abdominal tenderness. There is no guarding or rebound. Negative signs include Murphy's sign and McBurney's sign.     Hernia: No hernia is present.    Musculoskeletal:        General: No deformity.     Right lower leg: No edema.     Left lower leg: No edema.  Skin:    General: Skin is warm and dry.     Findings: No rash.  Neurological:     GCS: GCS eye subscore is 4. GCS verbal subscore is 5. GCS motor subscore is 6.     Cranial Nerves: No cranial nerve deficit.     Comments: Eyes closed, does not open eyes to voice or any stimuli  Does not follow commands.  Has a baseline left hemiparesis from previous stroke  Noted to have rhythmic movements of right upper extremity and right shoulder  Psychiatric:        Speech: She is noncommunicative.      ED Treatments / Results  Labs (all labs ordered are listed, but only abnormal results are displayed) Labs Reviewed  CBC - Abnormal; Notable for the following components:      Result Value   WBC 13.2 (*)    All other components within normal limits  DIFFERENTIAL - Abnormal; Notable for the following components:   Neutro Abs 10.4 (*)    All other components within normal limits  COMPREHENSIVE METABOLIC PANEL - Abnormal; Notable for the following components:   Glucose, Bld 136 (*)    BUN 45 (*)    Creatinine, Ser 1.23 (*)    Calcium 8.7 (*)    Albumin 3.2 (*)    Alkaline Phosphatase 258 (*)    GFR calc non Af Amer 41 (*)    GFR calc Af Amer 47 (*)    All other components within normal limits  URINALYSIS, ROUTINE W REFLEX MICROSCOPIC - Abnormal; Notable for the following components:   Color, Urine AMBER (*)    APPearance TURBID (*)    Hgb urine dipstick MODERATE (*)    Bilirubin Urine SMALL (*)    Ketones, ur 5 (*)    Protein, ur 100 (*)    Leukocytes,Ua TRACE (*)    RBC / HPF >50 (*)    WBC, UA >50 (*)    Bacteria, UA MANY (*)    Non Squamous Epithelial 6-10 (*)    All other components within normal limits  I-STAT CHEM 8, ED - Abnormal; Notable for the following components:   BUN 41 (*)    Creatinine, Ser 1.20 (*)    Glucose, Bld 131 (*)  All other components  within normal limits  URINE CULTURE  CULTURE, BLOOD (ROUTINE X 2)  CULTURE, BLOOD (ROUTINE X 2)  ETHANOL  PROTIME-INR  APTT  RAPID URINE DRUG SCREEN, HOSP PERFORMED  LACTIC ACID, PLASMA  TSH    EKG EKG Interpretation  Date/Time:  Monday July 01 2019 03:38:41 EST Ventricular Rate:  88 PR Interval:    QRS Duration: 89 QT Interval:  384 QTC Calculation: 465 R Axis:   -22 Text Interpretation: Sinus rhythm Borderline left axis deviation Abnormal R-wave progression, late transition No significant change since last tracing Confirmed by Gilda CreasePollina, Jeroline Wolbert J [phone]([id number]) on 07/01/2019 3:44:35 AM   Radiology Ct Head Wo Contrast  Result Date: 07/01/2019 CLINICAL DATA:  85 year old female with unexplained altered mental status. Found unresponsive. EXAM: CT HEAD WITHOUT CONTRAST TECHNIQUE: Contiguous axial images were obtained from the base of the skull through the vertex without intravenous contrast. COMPARISON:  None. FINDINGS: Brain: Perisylvian cerebral volume loss. No midline shift, mass effect, or evidence of intracranial mass lesion. No ventriculomegaly. No acute intracranial hemorrhage identified. Widespread and confluent bilateral cerebral white matter hypodensity. Heterogeneous hypodensity in the bilateral deep gray nuclei, especially the thalami. Brainstem and cerebellum appear within normal limits. No cortically based acute infarct identified. Vascular: Calcified atherosclerosis at the skull base. No suspicious intracranial vascular hyperdensity. Skull: No acute osseous abnormality identified. Sinuses/Orbits: Right maxillary sinus opacified with periosteal thickening. Right frontal sinus mucosal thickening and osteoma, but other paranasal sinuses are well pneumatized. Tympanic cavities and mastoids are clear. Other: Visualized orbits and scalp soft tissues are within normal limits. IMPRESSION: 1. No acute cortically based infarct or acute intracranial hemorrhage identified. 2. Advanced  small vessel disease with no prior study for comparison. 3. Chronic right maxillary sinusitis. Electronically Signed   By: Odessa FlemingH  Hall M.D.   On: 07/01/2019 04:18    Procedures .Central Line  Date/Time: 07/01/2019 6:04 AM Performed by: Gilda CreasePollina, Clark Cuff J, MD Authorized by: Gilda CreasePollina, Darnetta Kesselman J, MD   Consent:    Consent obtained:  Written   Consent given by:  Healthcare agent (daughter)   Risks discussed:  Arterial puncture, incorrect placement, nerve damage, bleeding, infection and pneumothorax Universal protocol:    Procedure explained and questions answered to patient or proxy's satisfaction: yes     Relevant documents present and verified: yes     Test results available and properly labeled: yes     Imaging studies available: yes     Required blood products, implants, devices, and special equipment available: yes     Site/side marked: yes     Immediately prior to procedure, a time out was called: yes     Patient identity confirmed:  Hospital-assigned identification number Pre-procedure details:    Hand hygiene: Hand hygiene performed prior to insertion     Sterile barrier technique: All elements of maximal sterile technique followed     Skin preparation:  2% chlorhexidine   Skin preparation agent: Skin preparation agent completely dried prior to procedure   Anesthesia (see MAR for exact dosages):    Anesthesia method:  Local infiltration   Local anesthetic:  Lidocaine 1% w/o epi Procedure details:    Location:  R internal jugular   Patient position:  Flat   Procedural supplies:  Triple lumen   Catheter size:  7 Fr   Landmarks identified: yes     Ultrasound guidance: yes     Sterile ultrasound techniques: Sterile gel and sterile probe covers were used     Number of attempts:  1   Successful placement: yes   Post-procedure details:    Post-procedure:  Dressing applied and line sutured   Assessment:  Blood return through all ports, no pneumothorax on x-ray, placement  verified by x-ray and free fluid flow   Patient tolerance of procedure:  Tolerated well, no immediate complications .Critical Care Performed by: Gilda Crease, MD Authorized by: Gilda Crease, MD   Critical care provider statement:    Critical care time (minutes):  40   Critical care time was exclusive of:  Separately billable procedures and treating other patients   Critical care was necessary to treat or prevent imminent or life-threatening deterioration of the following conditions:  Circulatory failure and CNS failure or compromise   Critical care was time spent personally by me on the following activities:  Ordering and performing treatments and interventions, development of treatment plan with patient or surrogate, ordering and review of laboratory studies, ordering and review of radiographic studies, discussions with consultants, pulse oximetry, evaluation of patient's response to treatment, re-evaluation of patient's condition, review of old charts and examination of patient   I assumed direction of critical care for this patient from another provider in my specialty: no     (including critical care time)  Medications Ordered in ED Medications  cefTRIAXone (ROCEPHIN) 2 g in sodium chloride 0.9 % 100 mL IVPB (has no administration in time range)  thiamine (B-1) injection 100 mg (has no administration in time range)  sodium chloride 0.9 % bolus 1,000 mL (has no administration in time range)  LORazepam (ATIVAN) injection 0.5 mg (0.5 mg Intravenous Given 07/01/19 0445)  levETIRAcetam (KEPPRA) IVPB 1000 mg/100 mL premix (0 mg Intravenous Stopped 07/01/19 0502)  sodium chloride 0.9 % bolus 500 mL (0 mLs Intravenous Stopped 07/01/19 0539)     Initial Impression / Assessment and Plan / ED Course  I have reviewed the triage vital signs and the nursing notes.  Pertinent labs & imaging results that were available during my care of the patient were reviewed by me and  considered in my medical decision making (see chart for details).        Patient presents to the emergency department for evaluation of mental status changes.  Patient is brought to the ER by her daughter.  Daughter reports that the patient has been in her normal state of health all day.  When the daughter checked on her at 8 PM tonight the patient would not respond.  Daughter reports that she had seen her approximately 2 to 3 hours before that and she was behaving normally.  Patient did have a previous stroke 2 years ago.  She has a left hemiparesis as a result of the stroke.  Upon examination I did notice some rhythmic movements of the right upper extremity and some lipsmacking.  This was concerning for possible seizure.  Patient does not have a history of seizures.  Neuro consultation was obtained.  Neurologist did agree that this could possibly represent seizure, likely from previous stroke.  It was recommended she be given Ativan and loaded with Keppra 1 g.  This was performed and the rhythmic motions did stop but patient became very sedated from the Ativan.  She then also became hypotensive.  She was given IV fluids with partial improvement.  Patient's urinalysis is grossly abnormal.  Because of this and hypotension, sepsis was considered.  A lactic acid has been added on.  Blood cultures have been added on and urine culture is pending.  I did have a lengthy conversation with the patient's daughter about CODE STATUS and further procedures.  Patient was confirmed as a DNR and DNI.  I did discuss the possibility of CNS infection causing seizure.  After lengthy conversations about risks and benefits and explaining the procedure, daughter has declined a lumbar puncture.  She understands that meningitis would be a significant life-threatening illness and does not want the procedure.  She reports that the patient has been "been through enough".  Daughter did, however, consent to a central line and  pressors if needed.  Central line was placed by myself.  Again, I suspect that the hypotension is secondary to the Ativan and will continue to monitor.  She was given 2 g of IV Rocephin to treat the urinary tract infection.  She will be admitted to medicine.  Final Clinical Impressions(s) / ED Diagnoses   Final diagnoses:  Encephalopathy acute  Urinary tract infection without hematuria, site unspecified    ED Discharge Orders    None       Gilda Crease, MD 07/01/19 [phone]

## 2019-07-01 NOTE — Consult Note (Signed)
[facility] TeleNeurology Consult Services  Stat Consult  Date of Service:   07/01/2019 03:46:40  Impression:     .  Seizure  Comments/Sign-Out: Ativan 1 mg IV, Load w Keppra 1 g IV cont Keppra 500mg  bid EEG, MRI Br w/wout seizure precautions, neurochecks d/w ED MD, d/w patient's daughter  Metrics: [facility] Notification Time: 07/01/2019 03:45:16 Stamp Time: 07/01/2019 03:46:40 Callback Response Time: 07/01/2019 03:54:00 Video Start Time: 07/01/2019 03:58:35  Our recommendations are outlined below.  Recommendations:     .  Ativan 1 mg Stat     .  Start Keppra 1000 mg IV x 1    Disposition: Neurology Follow Up Recommended  Sign Out: Admit for seizure workup Ativan 1 mg IV, Load w Keppra 1 g IV cont Keppra 500mg  bid EEG, MRI Br w/wout seizure precautions, neurochecks d/w ED MD, d/w patient's daughter     .  Discussed with Emergency Department Provider  ----------------------------------------------------------------------------------------------------  Chief Complaint: altered mental status  History of Present Illness: Patient is a 85 year old Female.  85 yo F h/o HTN, HL, on aspirin, previous stroke w L hemiparesis - no movement L arm, decreased movement L leg, not standing since hospitalization 1 mo ago p/w altered mental status 11/22, pt w episodes of abnormal movements.   Past Medical History:     . Stroke  Anticoagulant use:  No  Antiplatelet use: aspirin Examination: BP(182/143), Pulse(92), Blood Glucose(131) NIHSS Cannot be Completed Due to Patient Status  Pt grunts to name, opens eyes to voice, wiggles toes to command, squeezes right hand to command.  Patient/Family was informed the Neurology Consult would happen via TeleHealth consult by way of interactive audio and video telecommunications and consented to receiving care in this manner.  Due to the immediate potential for life-threatening deterioration due to underlying acute neurologic  illness, I spent 30 minutes providing critical care. This time includes time for face to face visit via telemedicine, review of medical records, imaging studies and discussion of findings with providers, the patient and/or family.   Dr Launa Grill   [facility] [phone]   Case [id number]

## 2019-07-01 NOTE — Progress Notes (Addendum)
Per HPI: Emma Crawford  is a 85 y.o. female, w h/o HTN, HL, on aspirin, previous stroke w L hemiparesis - no movement L arm, decreased movement L leg, not standing since hospitalization 1 mo ago p/w altered mental status 11/22, pt w episodes of abnormal movements.  Patient lives at home with daughter who helps to take care of her.  Daughter went to give her her 8pm medications and found patient hard to arouse.  She monitored patient throughout the night, and then when patient started having abnormal body movements around 3 - 4 in the morning they came into the ER.  Patient does not have a history of seizures.  Patient is sedated, possibly postictal, is not able to provide history.  No family at bedside.  11/23: Patient was admitted for seizure work-up and was loaded with Keppra and started on Keppra 500 twice daily with EEG pending.  Brain MRI with no acute findings.  She is noted to have prior history of CVA and hemiparesis.  She is also noted to have sepsis secondary to UTI and has been started on Rocephin with urine cultures pending.  Her blood pressures have improved without use of pressors.  She is DNR and may transfer to telemetry.  Full neurology consultation as well as EEG pending.  We will plan to consult palliative care for goals of care discussion.  Total care time: 25 minutes.

## 2019-07-01 NOTE — ED Triage Notes (Addendum)
[facility] - EMS stated that she was unresponsive upon arrival, family couldn't tell them anything. Pt vomited upon arrival to ED. Pt's daughter stated she went to give the pt her medication about 2000 last night and found her unresponsive, but thought she would be okay. Pt's daughter states that she called 911 after she started twitching and was still unresponsive

## 2019-07-01 NOTE — Progress Notes (Signed)
EEG Completed; Results Pending  

## 2019-07-01 NOTE — Procedures (Signed)
Patient Name: Emma Crawford  MRN: [id number]  Epilepsy Attending: Lora Havens  Referring Physician/Provider: Dr Heath Lark Date: 07/01/2019 Duration: 21.75mins  Patient history: 85yo F with ams and seizure like episode. EEG to evaluate for seizure  Level of alertness: awake/lethargic  AEDs during EEG study: LEV  Technical aspects: This EEG study was done with scalp electrodes positioned according to the 10-20 International system of electrode placement. Electrical activity was acquired at a sampling rate of 500Hz  and reviewed with a high frequency filter of 70Hz  and a low frequency filter of 1Hz . EEG data were recorded continuously and digitally stored.   DESCRIPTION: EEG showed continuous generalized 4-6hz  theta-delta slowing. No clear posterior dominant rhythm was seen. Hyperventilation and photic stimulation were not performed.  Of note, study was technically limited due to significant myogenic artifact.  ABNORMALITY - Continuous slow, generalized  IMPRESSION: This technically difficult study is suggestive of moderate diffuse encephalopathy, non specific to etiology. No seizures or epileptiform discharges were seen throughout the recording.  Quinn Quam Barbra Sarks

## 2019-07-02 DIAGNOSIS — R569 Unspecified convulsions: Secondary | ICD-10-CM | POA: Diagnosis not present

## 2019-07-02 DIAGNOSIS — G9340 Encephalopathy, unspecified: Secondary | ICD-10-CM | POA: Diagnosis not present

## 2019-07-02 DIAGNOSIS — Z515 Encounter for palliative care: Secondary | ICD-10-CM

## 2019-07-02 DIAGNOSIS — N390 Urinary tract infection, site not specified: Secondary | ICD-10-CM | POA: Diagnosis not present

## 2019-07-02 DIAGNOSIS — Z7189 Other specified counseling: Secondary | ICD-10-CM | POA: Diagnosis not present

## 2019-07-02 DIAGNOSIS — A419 Sepsis, unspecified organism: Secondary | ICD-10-CM | POA: Diagnosis not present

## 2019-07-02 LAB — CBC
HCT: 34.5 % — ABNORMAL LOW (ref 36.0–46.0)
Hemoglobin: 10.6 g/dL — ABNORMAL LOW (ref 12.0–15.0)
MCH: 27.4 pg (ref 26.0–34.0)
MCHC: 30.7 g/dL (ref 30.0–36.0)
MCV: 89.1 fL (ref 80.0–100.0)
Platelets: 187 10*3/uL (ref 150–400)
RBC: 3.87 MIL/uL (ref 3.87–5.11)
RDW: 13.4 % (ref 11.5–15.5)
WBC: 14.4 10*3/uL — ABNORMAL HIGH (ref 4.0–10.5)
nRBC: 0 % (ref 0.0–0.2)

## 2019-07-02 LAB — BASIC METABOLIC PANEL
Anion gap: 10 (ref 5–15)
BUN: 41 mg/dL — ABNORMAL HIGH (ref 8–23)
CO2: 20 mmol/L — ABNORMAL LOW (ref 22–32)
Calcium: 8.4 mg/dL — ABNORMAL LOW (ref 8.9–10.3)
Chloride: 118 mmol/L — ABNORMAL HIGH (ref 98–111)
Creatinine, Ser: 1.01 mg/dL — ABNORMAL HIGH (ref 0.44–1.00)
GFR calc Af Amer: 60 mL/min — ABNORMAL LOW (ref >60)
GFR calc non Af Amer: 51 mL/min — ABNORMAL LOW (ref >60)
Glucose, Bld: 98 mg/dL (ref 70–99)
Potassium: 3.9 mmol/L (ref 3.5–5.1)
Sodium: 148 mmol/L — ABNORMAL HIGH (ref 135–145)

## 2019-07-02 LAB — VITAMIN B12: Vitamin B-12: 149 pg/mL — ABNORMAL LOW (ref 180–914)

## 2019-07-02 MED ORDER — CARVEDILOL 3.125 MG PO TABS
6.2500 mg | ORAL_TABLET | Freq: Two times a day (BID) | ORAL | Status: DC
  Administered 2019-07-02 – 2019-07-04 (×4): 6.25 mg via ORAL
  Filled 2019-07-02 (×6): qty 2

## 2019-07-02 MED ORDER — LABETALOL HCL 5 MG/ML IV SOLN
10.0000 mg | INTRAVENOUS | Status: DC | PRN
  Administered 2019-07-04 – 2019-07-05 (×3): 10 mg via INTRAVENOUS
  Filled 2019-07-02 (×3): qty 4

## 2019-07-02 MED ORDER — FENTANYL CITRATE (PF) 100 MCG/2ML IJ SOLN: 6.2500 ug | INTRAMUSCULAR | Status: DC | PRN

## 2019-07-02 MED ORDER — ACETAMINOPHEN 325 MG PO TABS
650.0000 mg | ORAL_TABLET | Freq: Four times a day (QID) | ORAL | Status: DC | PRN
  Administered 2019-07-02 – 2019-07-04 (×2): 650 mg via ORAL
  Filled 2019-07-02 (×2): qty 2

## 2019-07-02 MED ORDER — LISINOPRIL 10 MG PO TABS
30.0000 mg | ORAL_TABLET | Freq: Every day | ORAL | Status: DC
  Administered 2019-07-02 – 2019-07-04 (×2): 30 mg via ORAL
  Filled 2019-07-02 (×4): qty 3

## 2019-07-02 MED ORDER — ENOXAPARIN SODIUM 40 MG/0.4ML ~~LOC~~ SOLN
40.0000 mg | SUBCUTANEOUS | Status: DC
  Administered 2019-07-02 – 2019-07-04 (×3): 40 mg via SUBCUTANEOUS
  Filled 2019-07-02 (×3): qty 0.4

## 2019-07-02 MED ORDER — DEXTROSE-NACL 5-0.45 % IV SOLN
INTRAVENOUS | Status: DC
  Administered 2019-07-02 – 2019-07-04 (×3): via INTRAVENOUS

## 2019-07-02 MED ORDER — CYANOCOBALAMIN 1000 MCG/ML IJ SOLN
1000.0000 ug | INTRAMUSCULAR | Status: AC
  Administered 2019-07-02: 1000 ug via INTRAMUSCULAR
  Filled 2019-07-02: qty 1

## 2019-07-02 NOTE — Consult Note (Signed)
Consultation Note Date: 07/02/2019   Patient Name: Emma Crawford  DOB: August 24, 1933  MRN: [id number]  Age / Sex: 85 y.o., female  PCP: Dulce Sellar, MD Referring Physician: Rodena Goldmann, DO  Reason for Consultation: Establishing goals of care and Hospice Evaluation  HPI/Patient Profile: 85 y.o. female  with past medical history of stroke with residual left hemiparesis, HTN admitted on 07/01/2019 found unresponsive with twitching movements by daughter who is her caregiver with concern for seizure. Also found to have UTI and blood cultures pending.   Clinical Assessment and Goals of Care: I met today with Emma Crawford' only child and her caregiver: daughter Mellody Drown. Mellody Drown is very attentive to her mother. She is upset that her mother is so sick and complains of pain and is not herself. She has seen a gradual decline over months but a steep decline after a visit to the ED ~1 month ago s/t constipation. She reports her mother has been mostly bed-bound this past month with decreased motivation to even attempt movement/activity. She has noted more issues with memory and personality changes. We discussed stroke evolution into dementia and progression as well as expectations. Throughout our conversation she often notes that she had been warned by many providers that her mother would progress to her current state.   We discussed goals moving forward and Emma Crawford has been clear about her wishes for DNR and to let her die peacefully when her time comes. She does not desire aggressive measures. She has also recently been talking of "being tired" and expressing desire to die. Emma Crawford is sad by this but wants her mother to be comfortable and happy.   All questions/concerns addressed. Emotional support provided.   Primary Decision Maker NEXT OF KIN daughter Mellody Drown    SUMMARY OF RECOMMENDATIONS   - Comfort focused  care - Continue treatment of infection (with hopes of some level of improvement) - Likely home with hospice  Code Status/Advance Care Planning:  DNR   Symptom Management:   Pain: Tylenol prn. Fentanyl IV prn.   Palliative Prophylaxis:   Aspiration, Bowel Regimen, Delirium Protocol, Frequent Pain Assessment, Oral Care and Turn Reposition  Psycho-social/Spiritual:   Desire for further Chaplaincy support:no  Additional Recommendations: Education on Hospice and Grief/Bereavement Support  Prognosis:   Overall prognosis poor. < 6 months. Prognosis could be much worse depending on progress.   Discharge Planning: Home with Hospice most likely     Primary Diagnoses: Present on Admission: **None**   I have reviewed the medical record, interviewed the patient and family, and examined the patient. The following aspects are pertinent.  Past Medical History:  Diagnosis Date  . Hypertension   . Stroke [facility])    Social History   Socioeconomic History  . Marital status: Widowed    Spouse name: Not on file  . Number of children: Not on file  . Years of education: Not on file  . Highest education level: Not on file  Occupational History  . Not on file  Social  Needs  . Financial resource strain: Not on file  . Food insecurity    Worry: Not on file    Inability: Not on file  . Transportation needs    Medical: Not on file    Non-medical: Not on file  Tobacco Use  . Smoking status: Never Smoker  . Smokeless tobacco: Former Systems developer    Types: Snuff  Substance and Sexual Activity  . Alcohol use: Not Currently  . Drug use: Not Currently  . Sexual activity: Not Currently  Lifestyle  . Physical activity    Days per week: Not on file    Minutes per session: Not on file  . Stress: Not on file  Relationships  . Social Herbalist on phone: Not on file    Gets together: Not on file    Attends religious service: Not on file    Active member of club or organization:  Not on file    Attends meetings of clubs or organizations: Not on file    Relationship status: Not on file  Other Topics Concern  . Not on file  Social History Narrative  . Not on file   No family history on file. Scheduled Meds: . Chlorhexidine Gluconate Cloth  6 each Topical Daily  . enoxaparin (LOVENOX) injection  30 mg Subcutaneous Q24H   Continuous Infusions: . sodium chloride 75 mL/hr at 07/02/19 0025  . cefTRIAXone (ROCEPHIN)  IV Stopped (07/01/19 1218)  . levETIRAcetam 500 mg (07/02/19 0559)   PRN Meds:.LORazepam Allergies  Allergen Reactions  . Penicillins Rash    Unknown Did it involve swelling of the face/tongue/throat, SOB, or low BP? No Did it involve sudden or severe rash/hives, skin peeling, or any reaction on the inside of your mouth or nose? No Did you need to seek medical attention at a hospital or doctor's office? No When did it last happen?Childhood If all above answers are "NO", may proceed with cephalosporin use.   Review of Systems  Unable to perform ROS: Dementia    Physical Exam Vitals signs and nursing note reviewed.  Constitutional:      General: She is not in acute distress.    Appearance: She is cachectic. She is ill-appearing.     Comments: Elderly, frail  Cardiovascular:     Rate and Rhythm: Normal rate.  Pulmonary:     Effort: Pulmonary effort is normal. No tachypnea, accessory muscle usage or respiratory distress.  Abdominal:     General: Abdomen is flat.     Palpations: Abdomen is soft.  Neurological:     Mental Status: She is lethargic.     Vital Signs: BP (!) 149/134   Pulse 92   Temp 98.8 F (37.1 C) (Axillary)   Resp (!) 23   Ht 5' (1.524 m)   Wt 55.9 kg   SpO2 100%   BMI 24.07 kg/m  Pain Scale: CPOT   Pain Score: 0-No pain   SpO2: SpO2: 100 % O2 Device:SpO2: 100 % O2 Flow Rate: .O2 Flow Rate (L/min): 0 L/min  IO: Intake/output summary:   Intake/Output Summary (Last 24 hours) at 07/02/2019 1008 Last  data filed at 07/02/2019 0313 Gross per 24 hour  Intake 1312.8 ml  Output -  Net 1312.8 ml    LBM: Last BM Date: 07/01/19 Baseline Weight: Weight: 59 kg Most recent weight: Weight: 55.9 kg     Palliative Assessment/Data:     Time In: 1500 Time Out: 1630 Time Total: 90 min Greater than  50%  of this time was spent counseling and coordinating care related to the above assessment and plan.  Signed by: Vinie Sill, NP Palliative Medicine Team Pager # [phone] (M-F 8a-5p) Team Phone # [phone] (Nights/Weekends)

## 2019-07-02 NOTE — Progress Notes (Signed)
PROGRESS NOTE    Emma Crawford  YKD:[id number] DOB: Jul 01, 1934 DOA: 07/01/2019 PCP: Dulce Sellar, MD   Brief Narrative:  Per HPI: [name] y.o.female,wh/o HTN, HL, on aspirin, previous stroke w L hemiparesis - no movement L arm, decreased movement L leg, not standing since hospitalization 1 mo ago p/w altered mental status 11/22, pt w episodes of abnormal movements.  Patient lives at home with daughter who helps to take care of her. Daughter went to give her her 8pmmedications and found patient hard to arouse. She monitored patient throughout the night, and then when patient started having abnormal body movements around 3 - 4in the morning they came into the ER. Patient does not have a history of seizures. Patient is sedated, possibly postictal, is not able to provide history. No family at bedside.  11/23: Patient was admitted for seizure work-up and was loaded with Keppra and started on Keppra 500 twice daily with EEG pending.  Brain MRI with no acute findings.  She is noted to have prior history of CVA and hemiparesis.  She is also noted to have sepsis secondary to UTI and has been started on Rocephin with urine cultures pending.  Her blood pressures have improved without use of pressors.  She is DNR and may transfer to telemetry.  Full neurology consultation as well as EEG pending.  We will plan to consult palliative care for goals of care discussion.  11/24: Patient seen and evaluated today and transferred to [place] bed.  She is noted to have some diarrhea which C. difficile and GI panel is currently pending.  Palliative care consultation currently pending.  Assessment & Plan:   Active Problems:   New onset seizure ([place])   Acute metabolic encephalopathy-multifactorial -Patient does have dementia with signs of microvascular ischemic changes and severe global atrophy noted on brain MRI, no new or acute findings. -Appears to be related to sepsis  secondary to UTI -EEG with no seizure activity noted, however this can be repeated as needed based on persisting symptoms, continue on Keppra for now per neurology recommendations -Dementia appears to be progressive with what sounds like bedbound status at home and poor oral intake.  Appreciate palliative care consultation  Sepsis secondary to UTI -No growth on urine cultures noted yet -Continue Rocephin for now  Hypotension-currently hypertensive -Resume Coreg and lisinopril  Diarrhea -Checking GI panel and C. Difficile -Patient previously on Linzess  Mild hypernatremia -Discontinue normal saline and start on half-normal saline  History of CVA with left hemiparesis -No new findings on brain MRI, however there is significant vascular disease -Holding aspirin and statin  DVT prophylaxis: Lovenox Code Status: DNR Family Communication: Discussed with daughter on 11/23, tried calling again today with no response Disposition Plan: Plan for likely disposition to home with hospice after palliative care evaluation.  Continue current antibiotics and Keppra.   Consultants:   Neurology  Palliative care  Procedures:   None  Antimicrobials:  Anti-infectives (From admission, onward)   Start     Dose/Rate Route Frequency Ordered Stop   07/01/19 1130  cefTRIAXone (ROCEPHIN) 1 g in sodium chloride 0.9 % 100 mL IVPB     1 g 200 mL/hr over 30 Minutes Intravenous Every 24 hours 07/01/19 1110     07/01/19 0515  cefTRIAXone (ROCEPHIN) 2 g in sodium chloride 0.9 % 100 mL IVPB     2 g 200 mL/hr over 30 Minutes Intravenous  Once 07/01/19 0509 07/01/19 0742       Subjective:  Patient seen and evaluated today with no new acute complaints or concerns noted aside from diarrhea overnight.  She has not had any throughout the morning however.  She remains nonverbal and appears to be in no acute distress.  Objective: Vitals:   07/02/19 1400 07/02/19 1538 07/02/19 1540 07/02/19 1541  BP: (!)  172/95 (!) 214/116 (!) 182/118 (!) 146/114  Pulse: 89 93 92   Resp: 18 19    Temp:      TempSrc:      SpO2: 100% 98%    Weight:      Height:        Intake/Output Summary (Last 24 hours) at 07/02/2019 1542 Last data filed at 07/02/2019 1426 Gross per 24 hour  Intake 2230.54 ml  Output --  Net 2230.54 ml   Filed Weights   07/01/19 0334 07/01/19 1100 07/02/19 0500  Weight: 59 kg 52.6 kg 55.9 kg    Examination:  General exam: Nonverbal, minimal distress Respiratory system: Clear to auscultation. Respiratory effort normal. Cardiovascular system: S1 & S2 heard, RRR. No JVD, murmurs, rubs, gallops or clicks. No pedal edema. Gastrointestinal system: Abdomen is nondistended, soft and nontender. No organomegaly or masses felt. Normal bowel sounds heard. Central nervous system: Poorly responsive Extremities: No significant edema Skin: No rashes, lesions or ulcers Psychiatry: Cannot be assessed given patient condition    Data Reviewed: I have personally reviewed following labs and imaging studies  CBC: Recent Labs  Lab 07/01/19 0350 07/01/19 0354 07/02/19 0529  WBC 13.2*  --  14.4*  NEUTROABS 10.4*  --   --   HGB 13.1 13.9 10.6*  HCT 42.5 41.0 34.5*  MCV 87.4  --  89.1  PLT 235  --  187   Basic Metabolic Panel: Recent Labs  Lab 07/01/19 0345 07/01/19 0350 07/01/19 0354 07/02/19 0529  NA  --  143 144 148*  K  --  4.3 4.3 3.9  CL  --  111 109 118*  CO2  --  22  --  20*  GLUCOSE  --  136* 131* 98  BUN  --  45* 41* 41*  CREATININE  --  1.23* 1.20* 1.01*  CALCIUM  --  8.7*  --  8.4*  MG 2.1  --   --   --    GFR: Estimated Creatinine Clearance: 33.1 mL/min (A) (by C-G formula based on SCr of 1.01 mg/dL (H)). Liver Function Tests: Recent Labs  Lab 07/01/19 0350  AST 19  ALT 12  ALKPHOS 258*  BILITOT 1.2  PROT 7.2  ALBUMIN 3.2*   No results for input(s): LIPASE, AMYLASE in the last 168 hours. No results for input(s): AMMONIA in the last 168  hours. Coagulation Profile: Recent Labs  Lab 07/01/19 0350  INR 1.2   Cardiac Enzymes: No results for input(s): CKTOTAL, CKMB, CKMBINDEX, TROPONINI in the last 168 hours. BNP (last 3 results) No results for input(s): PROBNP in the last 8760 hours. HbA1C: No results for input(s): HGBA1C in the last 72 hours. CBG: No results for input(s): GLUCAP in the last 168 hours. Lipid Profile: No results for input(s): CHOL, HDL, LDLCALC, TRIG, CHOLHDL, LDLDIRECT in the last 72 hours. Thyroid Function Tests: Recent Labs    07/01/19 0350 07/01/19 0630  TSH  --  0.130*  FREET4 1.65*  --    Anemia Panel: Recent Labs    07/01/19 0630  VITAMINB12 149*   Sepsis Labs: Recent Labs  Lab 07/01/19 0455  LATICACIDVEN 0.8    Recent  Results (from the past 240 hour(s))  Urine culture     Status: None (Preliminary result)   Collection Time: 07/01/19  4:55 AM   Specimen: Urine, Random  Result Value Ref Range Status   Specimen Description   Final    URINE, RANDOM Performed at [facility], [address], [place], [place] [address]    Special Requests   Final    NONE Performed at [facility], [address] [place], [place] [address]    Culture   Final    CULTURE REINCUBATED FOR BETTER GROWTH Performed at [facility], [address], [place], [place] [address]    Report Status PENDING  Incomplete  Culture, blood (routine x 2)     Status: None (Preliminary result)   Collection Time: 07/01/19  4:55 AM   Specimen: BLOOD  Result Value Ref Range Status   Specimen Description BLOOD RIGHT IJ DRAWN BY RN  Final   Special Requests   Final    BOTTLES DRAWN AEROBIC AND ANAEROBIC Blood Culture adequate volume   Culture  Setup Time   Final    GRAM POSITIVE COCCI AEROBIC BOTTLE ONLY Gram Stain Report Called to,Read Back By and Verified With: HYLTON L. AT 1353 ON [id number] BY THOMPSON S. Performed at [facility], [address], [place], [place] [address]    Culture PENDING  Incomplete    Report Status PENDING  Incomplete  SARS Coronavirus 2 by RT PCR (hospital order, performed in [facility] hospital lab) Nasopharyngeal Nasopharyngeal Swab     Status: None   Collection Time: 07/01/19  6:11 AM   Specimen: Nasopharyngeal Swab  Result Value Ref Range Status   SARS Coronavirus 2 NEGATIVE NEGATIVE Final    Comment: (NOTE) If result is NEGATIVE SARS-CoV-2 target nucleic acids are NOT DETECTED. The SARS-CoV-2 RNA is generally detectable in upper and lower  respiratory specimens during the acute phase of infection. The lowest  concentration of SARS-CoV-2 viral copies this assay can detect is 250  copies / mL. A negative result does not preclude SARS-CoV-2 infection  and should not be used as the sole basis for treatment or other  patient management decisions.  A negative result may occur with  improper specimen collection / handling, submission of specimen other  than nasopharyngeal swab, presence of viral mutation(s) within the  areas targeted by this assay, and inadequate number of viral copies  (<250 copies / mL). A negative result must be combined with clinical  observations, patient history, and epidemiological information. If result is POSITIVE SARS-CoV-2 target nucleic acids are DETECTED. The SARS-CoV-2 RNA is generally detectable in upper and lower  respiratory specimens dur ing the acute phase of infection.  Positive  results are indicative of active infection with SARS-CoV-2.  Clinical  correlation with patient history and other diagnostic information is  necessary to determine patient infection status.  Positive results do  not rule out bacterial infection or co-infection with other viruses. If result is PRESUMPTIVE POSTIVE SARS-CoV-2 nucleic acids MAY BE PRESENT.   A presumptive positive result was obtained on the submitted specimen  and confirmed on repeat testing.  While 2019 novel coronavirus  (SARS-CoV-2) nucleic acids may be present in the submitted sample   additional confirmatory testing may be necessary for epidemiological  and / or clinical management purposes  to differentiate between  SARS-CoV-2 and other Sarbecovirus currently known to infect humans.  If clinically indicated additional testing with an alternate test  methodology [phone]) is advised. The  SARS-CoV-2 RNA is generally  detectable in upper and lower respiratory sp ecimens during the acute  phase of infection. The expected result is Negative. Fact Sheet for Patients:  BoilerBrush.com.cy Fact Sheet for Healthcare Providers: https://pope.com/ This test is not yet approved or cleared by the [place] FDA and has been authorized for detection and/or diagnosis of SARS-CoV-2 by FDA under an Emergency Use Authorization (EUA).  This EUA will remain in effect (meaning this test can be used) for the duration of the COVID-19 declaration under Section 564(b)(1) of the Act, 21 U.S.C. section 360bbb-3(b)(1), unless the authorization is terminated or revoked sooner. Performed at [facility], [address], [place], [place] [address]   Culture, blood (routine x 2)     Status: None (Preliminary result)   Collection Time: 07/01/19  7:55 AM   Specimen: BLOOD  Result Value Ref Range Status   Specimen Description BLOOD NECK RIGHT DRAWN BY RN  Final   Special Requests   Final    BOTTLES DRAWN AEROBIC AND ANAEROBIC Blood Culture adequate volume   Culture   Final    NO GROWTH < 24 HOURS Performed at [facility], [address] [place], [place] [address]    Report Status PENDING  Incomplete  MRSA PCR Screening     Status: None   Collection Time: 07/01/19 11:07 AM   Specimen: Nasal Mucosa; Nasopharyngeal  Result Value Ref Range Status   MRSA by PCR NEGATIVE NEGATIVE Final    Comment:        The GeneXpert MRSA Assay (FDA approved for NASAL specimens only), is one component of a comprehensive MRSA colonization surveillance program.  It is not intended to diagnose MRSA infection nor to guide or monitor treatment for MRSA infections. Performed at [facility], [address], [place], [place] [address]          Radiology Studies: Ct Head Wo Contrast  Result Date: 07/01/2019 CLINICAL DATA:  85 year old female with unexplained altered mental status. Found unresponsive. EXAM: CT HEAD WITHOUT CONTRAST TECHNIQUE: Contiguous axial images were obtained from the base of the skull through the vertex without intravenous contrast. COMPARISON:  None. FINDINGS: Brain: Perisylvian cerebral volume loss. No midline shift, mass effect, or evidence of intracranial mass lesion. No ventriculomegaly. No acute intracranial hemorrhage identified. Widespread and confluent bilateral cerebral white matter hypodensity. Heterogeneous hypodensity in the bilateral deep gray nuclei, especially the thalami. Brainstem and cerebellum appear within normal limits. No cortically based acute infarct identified. Vascular: Calcified atherosclerosis at the skull base. No suspicious intracranial vascular hyperdensity. Skull: No acute osseous abnormality identified. Sinuses/Orbits: Right maxillary sinus opacified with periosteal thickening. Right frontal sinus mucosal thickening and osteoma, but other paranasal sinuses are well pneumatized. Tympanic cavities and mastoids are clear. Other: Visualized orbits and scalp soft tissues are within normal limits. IMPRESSION: 1. No acute cortically based infarct or acute intracranial hemorrhage identified. 2. Advanced small vessel disease with no prior study for comparison. 3. Chronic right maxillary sinusitis. Electronically Signed   By: Odessa Fleming M.D.   On: 07/01/2019 04:18   Mr Brain Wo Contrast  Result Date: 07/01/2019 CLINICAL DATA:  Seizure, new, abnormal neuro exam, nontraumatic EXAM: MRI HEAD WITHOUT CONTRAST TECHNIQUE: Multiplanar, multiecho pulse sequences of the brain and surrounding structures were obtained without  intravenous contrast. COMPARISON:  Head CT 07/01/2019 FINDINGS: Brain: Multiple sequences are motion degraded. Most notably, there is moderate motion degradation of the sagittal T1 weighted imaging. The diffusion-weighted imaging is of good quality. No convincing evidence of  acute infarct. No evidence of intracranial mass. No midline shift or extra-axial fluid collection. No chronic intracranial blood products. Advanced chronic small vessel ischemic disease within the cerebral white matter, also involving the bilateral basal ganglia and thalami. Small focus of chronic hemorrhage within the right thalamus. Moderate generalized parenchymal atrophy. Vascular: Flow voids maintained within the proximal large arterial vessels. Skull and upper cervical spine: No focal marrow lesion identified on motion degraded imaging. Sinuses/Orbits: Visualized orbits demonstrate no acute abnormality. Complete opacification of the right maxillary sinus. Mucosal thickening also present within the right frontal and ethmoid sinuses. IMPRESSION: 1. Intermittently motion degraded examination. 2. No evidence of acute intracranial abnormality, including acute infarct. 3. No seizure focus is definitively identified, although a dedicated seizure protocol was not performed. 4. Moderate generalized parenchymal atrophy with advanced chronic small vessel ischemic disease. 5. Chronic right maxillary sinusitis. Electronically Signed   By: Jackey LogeKyle  Golden DO   On: 07/01/2019 09:46   Dg Chest Port 1 View  Result Date: 07/01/2019 CLINICAL DATA:  85 year old female is unresponsive. Line placement. EXAM: PORTABLE CHEST 1 VIEW COMPARISON:  Portable chest 01/23/2019. FINDINGS: Portable AP semi upright view at 0612 hours. Right IJ approach central line in place. Tip projects at the cavoatrial junction level. No pneumothorax. Stable cardiac size and mediastinal contours. Continued low lung volumes. Allowing for portable technique the lungs are clear. Negative  visible bowel gas pattern. No acute osseous abnormality identified. IMPRESSION: 1. Right IJ central line placed, tip at the cavoatrial junction level. 2. No pneumothorax or acute cardiopulmonary abnormality. Electronically Signed   By: Odessa FlemingH  Hall M.D.   On: 07/01/2019 06:38        Scheduled Meds:  Chlorhexidine Gluconate Cloth  6 each Topical Daily   enoxaparin (LOVENOX) injection  40 mg Subcutaneous Q24H   Continuous Infusions:  sodium chloride 75 mL/hr at 07/02/19 1514   cefTRIAXone (ROCEPHIN)  IV 1 g (07/02/19 1225)   levETIRAcetam 500 mg (07/02/19 1426)     LOS: 1 day    Time spent: 30 minutes    Kelsha Older Hoover Brunette Lorik Guo, DO [facility] Pager [phone]  If 7PM-7AM, please contact night-coverage [email or web address] Password [email or web address] 07/02/2019, 3:42 PM

## 2019-07-02 NOTE — Consult Note (Signed)
[facility] Saahil Herbster A. Gerilyn Pilgrim, MD     [email or web address]          Emma Crawford is an 85 y.o. female.   Assessment/Plan: 1. Encephalopathy: Etiology is most likely multifactorial including UTI, pre renal azotemia and seizures. Supportive care as is currently being carried out. 2. Jerking of the upper extremities right more than that if suspicious for seizures: No jerking on Keppra. Continue with the current dose. 3. 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 Severe confluent chronic microvascular ischemic changes on MRI along with severe global atrophy: This increase the risk of vascular dementia long-term.  5. Vitamin B12 deficiency: This will be replaced.   It appears the patient has improved overnight. No evidence of jerking noted at this time.    GENERAL:  This is a thin female who appears to be in discomfort but no acute distress.  HEENT:  Neck is supple no trauma appreciated.  ABDOMEN: Soft  EXTREMITIES: No edema   BACK: Normal alignment.  SKIN: Normal by inspection.    MENTAL STATUS:   She is more responsive today. She opens her eyes to light sternal rub and follows midline and appendicular commands. She speaks in 3-4 word sentences. She has a severe dysarthria.  CRANIAL NERVES: Pupils are equal, round and reactive to light; extraocular movements are full, there is no significant nystagmus; upper and lower facial muscles are normal in strength and symmetric, there is no flattening of the nasolabial folds; tongue is midline. She does seem to respond to direct threat bilaterally suggestive of intact visual fields. Corneal reflexes are intact.  MOTOR: She has antigravity strength in the upper extremities. She does withdrawal to pain in the legs bilaterally. There is near persistent jerking consistent with a twitching flexion movements of the right upper extremity. Infrequently the left upper extremity is also noted with some  twitching of the left thigh at times. There is alternating increased tone of the upper extremities.  COORDINATION:  No bradykinesia, rigidity or parkinsonism is noted. Movements as described above on the motor exam.  SENSATION:  She responds to painful stimuli bilaterally.     Objective: Vital signs in last 24 hours: Temp:  [98.3 F (36.8 C)-98.8 F (37.1 C)] 98.3 F (36.8 C) (11/24 1146) Pulse Rate:  [86-100] 92 (11/24 1540) Resp:  [14-29] 19 (11/24 1538) BP: (105-214)/(76-154) 146/114 (11/24 1541) SpO2:  [98 %-100 %] 98 % (11/24 1538) Weight:  [55.9 kg] 55.9 kg (11/24 0500)  Intake/Output from previous day: 11/23 0701 - 11/24 0700 In: 1312.8 [I.V.:1012.7; IV Piggyback:300.1] Out: -  Intake/Output this shift: No intake/output data recorded. Nutritional status:  Diet Order            Diet NPO time specified Except for: Sips with Meds  Diet effective now               Lab Results: Results for orders placed or performed during the hospital encounter of 07/01/19 (from the past 48 hour(s))  Urine rapid drug screen (hosp performed)     Status: None   Collection Time: 07/01/19  3:45 AM  Result Value Ref Range   Opiates NONE DETECTED NONE DETECTED   Cocaine NONE DETECTED NONE DETECTED   Benzodiazepines NONE DETECTED NONE DETECTED   Amphetamines NONE DETECTED NONE DETECTED   Tetrahydrocannabinol NONE DETECTED NONE DETECTED   Barbiturates NONE DETECTED NONE DETECTED    Comment: (NOTE) DRUG SCREEN FOR MEDICAL PURPOSES ONLY.  IF CONFIRMATION IS NEEDED FOR ANY  PURPOSE, NOTIFY LAB WITHIN 5 DAYS. LOWEST DETECTABLE LIMITS FOR URINE DRUG SCREEN Drug Class                     Cutoff (ng/mL) Amphetamine and metabolites    1000 Barbiturate and metabolites    200 Benzodiazepine                 200 Tricyclics and metabolites     300 Opiates and metabolites        300 Cocaine and metabolites        300 THC                            50 Performed at [facility],  [address] [place], [place] [address]   Urinalysis, Routine w reflex microscopic     Status: Abnormal   Collection Time: 07/01/19  3:45 AM  Result Value Ref Range   Color, Urine AMBER (A) YELLOW    Comment: BIOCHEMICALS MAY BE AFFECTED BY COLOR   APPearance TURBID (A) CLEAR   Specific Gravity, Urine 1.017 1.005 - 1.030   pH 6.0 5.0 - 8.0   Glucose, UA NEGATIVE NEGATIVE mg/dL   Hgb urine dipstick MODERATE (A) NEGATIVE   Bilirubin Urine SMALL (A) NEGATIVE   Ketones, ur 5 (A) NEGATIVE mg/dL   Protein, ur 546 (A) NEGATIVE mg/dL   Nitrite NEGATIVE NEGATIVE   Leukocytes,Ua TRACE (A) NEGATIVE   RBC / HPF >50 (H) 0 - 5 RBC/hpf   WBC, UA >50 (H) 0 - 5 WBC/hpf   Bacteria, UA MANY (A) NONE SEEN   Squamous Epithelial / LPF 6-10 0 - 5   WBC Clumps PRESENT    Mucus PRESENT    Budding Yeast PRESENT    Ca Oxalate Crys, UA PRESENT    Non Squamous Epithelial 6-10 (A) NONE SEEN    Comment: Performed at [facility], [address] [place], [place] [address]  Magnesium     Status: None   Collection Time: 07/01/19  3:45 AM  Result Value Ref Range   Magnesium 2.1 1.7 - 2.4 mg/dL    Comment: Performed at [facility], [address], [place], [place] [address]  Ethanol     Status: None   Collection Time: 07/01/19  3:50 AM  Result Value Ref Range   Alcohol, Ethyl (B) <10 <10 mg/dL    Comment: (NOTE) Lowest detectable limit for serum alcohol is 10 mg/dL. For medical purposes only. Performed at [facility], [address] [place], [place] [address]   Protime-INR     Status: None   Collection Time: 07/01/19  3:50 AM  Result Value Ref Range   Prothrombin Time 14.9 11.4 - 15.2 seconds   INR 1.2 0.8 - 1.2    Comment: (NOTE) INR goal varies based on device and disease states. Performed at [facility], [address] [place], [place] [address]   APTT     Status: None   Collection Time: 07/01/19  3:50 AM  Result Value Ref Range   aPTT 35 24 - 36 seconds    Comment: Performed at [facility], [address], [place], [place] [address]  CBC     Status: Abnormal   Collection Time: 07/01/19  3:50 AM  Result Value Ref Range   WBC 13.2 (H) 4.0 - 10.5 K/uL   RBC 4.86 3.87 - 5.11 MIL/uL   Hemoglobin 13.1 12.0 - 15.0 g/dL  HCT 42.5 36.0 - 46.0 %   MCV 87.4 80.0 - 100.0 fL   MCH 27.0 26.0 - 34.0 pg   MCHC 30.8 30.0 - 36.0 g/dL   RDW 09.813.4 11.911.5 - 14.715.5 %   Platelets 235 150 - 400 K/uL   nRBC 0.0 0.0 - 0.2 %    Comment: Performed at [facility], [address] [place], [place] [address]  Differential     Status: Abnormal   Collection Time: 07/01/19  3:50 AM  Result Value Ref Range   Neutrophils Relative % 78 %   Neutro Abs 10.4 (H) 1.7 - 7.7 K/uL   Lymphocytes Relative 13 %   Lymphs Abs 1.7 0.7 - 4.0 K/uL   Monocytes Relative 8 %   Monocytes Absolute 1.0 0.1 - 1.0 K/uL   Eosinophils Relative 0 %   Eosinophils Absolute 0.0 0.0 - 0.5 K/uL   Basophils Relative 0 %   Basophils Absolute 0.0 0.0 - 0.1 K/uL   Immature Granulocytes 1 %   Abs Immature Granulocytes 0.07 0.00 - 0.07 K/uL    Comment: Performed at [facility], [address] [place], [place] [address]  Comprehensive metabolic panel     Status: Abnormal   Collection Time: 07/01/19  3:50 AM  Result Value Ref Range   Sodium 143 135 - 145 mmol/L   Potassium 4.3 3.5 - 5.1 mmol/L   Chloride 111 98 - 111 mmol/L   CO2 22 22 - 32 mmol/L   Glucose, Bld 136 (H) 70 - 99 mg/dL   BUN 45 (H) 8 - 23 mg/dL   Creatinine, Ser 6.571.23 (H) 0.44 - 1.00 mg/dL   Calcium 8.7 (L) 8.9 - 10.3 mg/dL   Total Protein 7.2 6.5 - 8.1 g/dL   Albumin 3.2 (L) 3.5 - 5.0 g/dL   AST 19 15 - 41 U/L   ALT 12 0 - 44 U/L   Alkaline Phosphatase 258 (H) 38 - 126 U/L   Total Bilirubin 1.2 0.3 - 1.2 mg/dL   GFR calc non Af Amer 41 (L) >60 mL/min   GFR calc Af Amer 47 (L) >60 mL/min   Anion gap 10 5 - 15    Comment: Performed at [facility], [address] [place], [place] [address]  T4, free     Status: Abnormal   Collection Time: 07/01/19  3:50 AM   Result Value Ref Range   Free T4 1.65 (H) 0.61 - 1.12 ng/dL    Comment: (NOTE) Biotin ingestion may interfere with free T4 tests. If the results are inconsistent with the TSH level, previous test results, or the clinical presentation, then consider biotin interference. If needed, order repeat testing after stopping biotin. Performed at [facility], [address] [place], [place] [address]   I-stat chem 8, ED     Status: Abnormal   Collection Time: 07/01/19  3:54 AM  Result Value Ref Range   Sodium 144 135 - 145 mmol/L   Potassium 4.3 3.5 - 5.1 mmol/L   Chloride 109 98 - 111 mmol/L   BUN 41 (H) 8 - 23 mg/dL   Creatinine, Ser 4.131.20 (H) 0.44 - 1.00 mg/dL   Glucose, Bld 244131 (H) 70 - 99 mg/dL   Calcium, Ion 0.101.19 2.721.15 - 1.40 mmol/L   TCO2 22 22 - 32 mmol/L   Hemoglobin 13.9 12.0 - 15.0 g/dL   HCT 53.641.0 64.436.0 - 03.446.0 %  Lactic acid, plasma     Status: None   Collection Time:  07/01/19  4:55 AM  Result Value Ref Range   Lactic Acid, Venous 0.8 0.5 - 1.9 mmol/L    Comment: Performed at [facility], [address] [place], [place] [address]  Urine culture     Status: None (Preliminary result)   Collection Time: 07/01/19  4:55 AM   Specimen: Urine, Random  Result Value Ref Range   Specimen Description      URINE, RANDOM Performed at [facility], [address], [place], [place] [address]    Special Requests      NONE Performed at [facility], [address], [place], [place] [address]    Culture      CULTURE REINCUBATED FOR BETTER GROWTH Performed at [facility], [address] [place], [place] [address]    Report Status PENDING   Culture, blood (routine x 2)     Status: None (Preliminary result)   Collection Time: 07/01/19  4:55 AM   Specimen: BLOOD  Result Value Ref Range   Specimen Description BLOOD RIGHT IJ DRAWN BY RN    Special Requests      BOTTLES DRAWN AEROBIC AND ANAEROBIC Blood Culture adequate volume   Culture  Setup Time      GRAM POSITIVE COCCI  AEROBIC BOTTLE ONLY Gram Stain Report Called to,Read Back By and Verified With: HYLTON L. AT 1353 ON [id number] BY THOMPSON S. Performed at [facility], [address], [place], [place] [address]    Culture PENDING    Report Status PENDING   SARS Coronavirus 2 by RT PCR (hospital order, performed in [facility] hospital lab) Nasopharyngeal Nasopharyngeal Swab     Status: None   Collection Time: 07/01/19  6:11 AM   Specimen: Nasopharyngeal Swab  Result Value Ref Range   SARS Coronavirus 2 NEGATIVE NEGATIVE    Comment: (NOTE) If result is NEGATIVE SARS-CoV-2 target nucleic acids are NOT DETECTED. The SARS-CoV-2 RNA is generally detectable in upper and lower  respiratory specimens during the acute phase of infection. The lowest  concentration of SARS-CoV-2 viral copies this assay can detect is 250  copies / mL. A negative result does not preclude SARS-CoV-2 infection  and should not be used as the sole basis for treatment or other  patient management decisions.  A negative result may occur with  improper specimen collection / handling, submission of specimen other  than nasopharyngeal swab, presence of viral mutation(s) within the  areas targeted by this assay, and inadequate number of viral copies  (<250 copies / mL). A negative result must be combined with clinical  observations, patient history, and epidemiological information. If result is POSITIVE SARS-CoV-2 target nucleic acids are DETECTED. The SARS-CoV-2 RNA is generally detectable in upper and lower  respiratory specimens dur ing the acute phase of infection.  Positive  results are indicative of active infection with SARS-CoV-2.  Clinical  correlation with patient history and other diagnostic information is  necessary to determine patient infection status.  Positive results do  not rule out bacterial infection or co-infection with other viruses. If result is PRESUMPTIVE POSTIVE SARS-CoV-2 nucleic acids MAY BE PRESENT.   A  presumptive positive result was obtained on the submitted specimen  and confirmed on repeat testing.  While 2019 novel coronavirus  (SARS-CoV-2) nucleic acids may be present in the submitted sample  additional confirmatory testing may be necessary for epidemiological  and / or clinical management purposes  to differentiate between  SARS-CoV-2 and other Sarbecovirus currently known to infect humans.  If clinically indicated additional testing with an alternate test  methodology [phone]) is advised. The SARS-CoV-2 RNA is generally  detectable in upper and lower respiratory sp ecimens during the acute  phase of infection. The expected result is Negative. Fact Sheet for Patients:  StrictlyIdeas.no Fact Sheet for Healthcare Providers: BankingDealers.co.za This test is not yet approved or cleared by the [place] FDA and has been authorized for detection and/or diagnosis of SARS-CoV-2 by FDA under an Emergency Use Authorization (EUA).  This EUA will remain in effect (meaning this test can be used) for the duration of the COVID-19 declaration under Section 564(b)(1) of the Act, 21 U.S.C. section 360bbb-3(b)(1), unless the authorization is terminated or revoked sooner. Performed at [facility], [address], [place], [place] [address]   TSH     Status: Abnormal   Collection Time: 07/01/19  6:30 AM  Result Value Ref Range   TSH 0.130 (L) 0.350 - 4.500 uIU/mL    Comment: Performed by a 3rd Generation assay with a functional sensitivity of <=0.01 uIU/mL. Performed at [facility], [address] [place], [place] [address]   Vitamin B12     Status: Abnormal   Collection Time: 07/01/19  6:30 AM  Result Value Ref Range   Vitamin B-12 149 (L) 180 - 914 pg/mL    Comment: (NOTE) This assay is not validated for testing neonatal or myeloproliferative syndrome specimens for Vitamin B12 levels. Performed at [facility], [address],  [place], [place] [address]   Culture, blood (routine x 2)     Status: None (Preliminary result)   Collection Time: 07/01/19  7:55 AM   Specimen: BLOOD  Result Value Ref Range   Specimen Description BLOOD NECK RIGHT DRAWN BY RN    Special Requests      BOTTLES DRAWN AEROBIC AND ANAEROBIC Blood Culture adequate volume   Culture      NO GROWTH < 24 HOURS Performed at [facility], [address] [place], [place] [address]    Report Status PENDING   MRSA PCR Screening     Status: None   Collection Time: 07/01/19 11:07 AM   Specimen: Nasal Mucosa; Nasopharyngeal  Result Value Ref Range   MRSA by PCR NEGATIVE NEGATIVE    Comment:        The GeneXpert MRSA Assay (FDA approved for NASAL specimens only), is one component of a comprehensive MRSA colonization surveillance program. It is not intended to diagnose MRSA infection nor to guide or monitor treatment for MRSA infections. Performed at [facility], [address], [place], [place] [address]   Blood gas, arterial     Status: Abnormal   Collection Time: 07/01/19 11:30 AM  Result Value Ref Range   FIO2 21.00    pH, Arterial 7.389 7.350 - 7.450   pCO2 arterial 37.0 32.0 - 48.0 mmHg   pO2, Arterial 65.0 (L) 83.0 - 108.0 mmHg   Bicarbonate 22.5 20.0 - 28.0 mmol/L   Acid-base deficit 2.3 (H) 0.0 - 2.0 mmol/L   O2 Saturation 91.3 %   Patient temperature 37.0    Allens test (pass/fail) BRACHIAL ARTERY (A) PASS    Comment: Performed at [facility], [address], [place], [place] [address]  AMBMP     Status: Abnormal   Collection Time: 07/02/19  5:29 AM  Result Value Ref Range   Sodium 148 (H) 135 - 145 mmol/L   Potassium 3.9 3.5 - 5.1 mmol/L   Chloride 118 (H) 98 - 111 mmol/L  CO2 20 (L) 22 - 32 mmol/L   Glucose, Bld 98 70 - 99 mg/dL   BUN 41 (H) 8 - 23 mg/dL   Creatinine, Ser 9.14 (H) 0.44 - 1.00 mg/dL   Calcium 8.4 (L) 8.9 - 10.3 mg/dL   GFR calc non Af Amer 51 (L) >60 mL/min   GFR calc Af Amer 60 (L) >60 mL/min   Anion gap  10 5 - 15    Comment: Performed at [facility], [address] [place], [place] [address]  [facility]     Status: Abnormal   Collection Time: 07/02/19  5:29 AM  Result Value Ref Range   WBC 14.4 (H) 4.0 - 10.5 K/uL   RBC 3.87 3.87 - 5.11 MIL/uL   Hemoglobin 10.6 (L) 12.0 - 15.0 g/dL   HCT 62.1 (L) 30.8 - 65.7 %   MCV 89.1 80.0 - 100.0 fL   MCH 27.4 26.0 - 34.0 pg   MCHC 30.7 30.0 - 36.0 g/dL   RDW 84.6 96.2 - 95.2 %   Platelets 187 150 - 400 K/uL   nRBC 0.0 0.0 - 0.2 %    Comment: Performed at [facility], [address] [place], [place] [address]    Lipid Panel No results for input(s): CHOL, TRIG, HDL, CHOLHDL, VLDL, LDLCALC in the last 72 hours.  Studies/Results:   Medications:  Scheduled Meds: . carvedilol  6.25 mg Oral BID  . Chlorhexidine Gluconate Cloth  6 each Topical Daily  . enoxaparin (LOVENOX) injection  40 mg Subcutaneous Q24H  . lisinopril  30 mg Oral Daily   Continuous Infusions: . cefTRIAXone (ROCEPHIN)  IV 1 g (07/02/19 1225)  . dextrose 5 % and 0.45% NaCl 60 mL/hr at 07/02/19 1638  . levETIRAcetam 500 mg (07/02/19 1426)   PRN Meds:.acetaminophen, fentaNYL (SUBLIMAZE) injection, LORazepam     LOS: 1 day   Denzel Etienne A. Gerilyn Pilgrim, M.D.  Diplomate, Biomedical engineer of Psychiatry and Neurology ( Neurology).

## 2019-07-03 DIAGNOSIS — N390 Urinary tract infection, site not specified: Secondary | ICD-10-CM | POA: Diagnosis not present

## 2019-07-03 DIAGNOSIS — I10 Essential (primary) hypertension: Secondary | ICD-10-CM

## 2019-07-03 DIAGNOSIS — R569 Unspecified convulsions: Secondary | ICD-10-CM | POA: Diagnosis not present

## 2019-07-03 DIAGNOSIS — Z515 Encounter for palliative care: Secondary | ICD-10-CM | POA: Diagnosis not present

## 2019-07-03 DIAGNOSIS — G9340 Encephalopathy, unspecified: Secondary | ICD-10-CM | POA: Diagnosis not present

## 2019-07-03 DIAGNOSIS — N179 Acute kidney failure, unspecified: Secondary | ICD-10-CM

## 2019-07-03 DIAGNOSIS — A419 Sepsis, unspecified organism: Secondary | ICD-10-CM | POA: Diagnosis not present

## 2019-07-03 DIAGNOSIS — Z7189 Other specified counseling: Secondary | ICD-10-CM | POA: Diagnosis not present

## 2019-07-03 LAB — CBC
HCT: 33 % — ABNORMAL LOW (ref 36.0–46.0)
Hemoglobin: 10.2 g/dL — ABNORMAL LOW (ref 12.0–15.0)
MCH: 27.5 pg (ref 26.0–34.0)
MCHC: 30.9 g/dL (ref 30.0–36.0)
MCV: 88.9 fL (ref 80.0–100.0)
Platelets: 190 10*3/uL (ref 150–400)
RBC: 3.71 MIL/uL — ABNORMAL LOW (ref 3.87–5.11)
RDW: 13.3 % (ref 11.5–15.5)
WBC: 13 10*3/uL — ABNORMAL HIGH (ref 4.0–10.5)
nRBC: 0 % (ref 0.0–0.2)

## 2019-07-03 LAB — BASIC METABOLIC PANEL
Anion gap: 9 (ref 5–15)
BUN: 35 mg/dL — ABNORMAL HIGH (ref 8–23)
CO2: 21 mmol/L — ABNORMAL LOW (ref 22–32)
Calcium: 8.8 mg/dL — ABNORMAL LOW (ref 8.9–10.3)
Chloride: 118 mmol/L — ABNORMAL HIGH (ref 98–111)
Creatinine, Ser: 0.81 mg/dL (ref 0.44–1.00)
GFR calc Af Amer: >60 mL/min (ref >60)
GFR calc non Af Amer: >60 mL/min (ref >60)
Glucose, Bld: 91 mg/dL (ref 70–99)
Potassium: 3.5 mmol/L (ref 3.5–5.1)
Sodium: 148 mmol/L — ABNORMAL HIGH (ref 135–145)

## 2019-07-03 LAB — URINE CULTURE: Culture: >=100000 — AB

## 2019-07-03 LAB — HOMOCYSTEINE: Homocysteine: 16.4 umol/L (ref 0.0–21.3)

## 2019-07-03 LAB — RPR: RPR Ser Ql: NONREACTIVE

## 2019-07-03 MED ORDER — ASPIRIN EC 81 MG PO TBEC
81.0000 mg | DELAYED_RELEASE_TABLET | Freq: Every day | ORAL | Status: DC
  Administered 2019-07-04: 81 mg via ORAL
  Filled 2019-07-03 (×3): qty 1

## 2019-07-03 MED ORDER — SACCHAROMYCES BOULARDII 250 MG PO CAPS
250.0000 mg | ORAL_CAPSULE | Freq: Two times a day (BID) | ORAL | Status: DC
  Administered 2019-07-03 – 2019-07-04 (×3): 250 mg via ORAL
  Filled 2019-07-03 (×4): qty 1

## 2019-07-03 MED ORDER — PRAVASTATIN SODIUM 40 MG PO TABS
40.0000 mg | ORAL_TABLET | Freq: Every day | ORAL | Status: DC
  Administered 2019-07-03 – 2019-07-04 (×2): 40 mg via ORAL
  Filled 2019-07-03 (×2): qty 1

## 2019-07-03 MED ORDER — SODIUM CHLORIDE 0.9 % IV SOLN
2.0000 g | INTRAVENOUS | Status: DC
  Administered 2019-07-03 – 2019-07-04 (×2): 2 g via INTRAVENOUS
  Filled 2019-07-03 (×2): qty 20

## 2019-07-03 NOTE — Plan of Care (Signed)

## 2019-07-03 NOTE — Care Management (Signed)
New referral for home with hospice. Palliative NP has discussed with daughter. She elected [facility] of hospice notified and will reach out to Emma Crawford (dtr). Patient will need a hospital bed prior to DC. Emma Crawford is also arranging for care givers in the home prior to DC.

## 2019-07-03 NOTE — Progress Notes (Addendum)
PROGRESS NOTE    Ceriah Crawford  LNL:[id number] DOB: [birth year] DOA: 07/01/2019 PCP: Dulce Sellar, MD   Brief Narrative:  Per HPI: Emma Crawford y.o.female,wh/o HTN, HL, on aspirin, previous stroke w L hemiparesis - no movement L arm, decreased movement L leg, not standing since hospitalization 1 mo ago p/w altered mental status 11/22, pt w episodes of abnormal movements.  Patient lives at home with daughter who helps to take care of her. Daughter went to give her her 8pmmedications and found patient hard to arouse. She monitored patient throughout the night, and then when patient started having abnormal body movements around 3 - 4in the morning they came into the ER. Patient does not have a history of seizures. Patient is sedated, possibly postictal, is not able to provide history. No family at bedside.  11/23: Patient was admitted for seizure work-up and was loaded with Keppra and started on Keppra 500 twice daily with EEG pending.  Brain MRI with no acute findings.  She is noted to have prior history of CVA and hemiparesis.  She is also noted to have sepsis secondary to UTI and has been started on Rocephin with urine cultures pending.  Her blood pressures have improved without use of pressors.  She is DNR and may transfer to telemetry.  Full neurology consultation as well as EEG pending.  We will plan to consult palliative care for goals of care discussion.  11/24: Patient seen and evaluated today and transferred to [place] bed.  She is noted to have some diarrhea which C. difficile and GI panel is currently pending.  Palliative care consultation currently pending.  11/25: Overall demonstrating improvement in her mentation.  Refusing some of her oral medications.  No further seizure or jerking activity appreciated.  Equipment from hospice at some point to be delivered on 07/05/2019.  Continue current antibiotics and supportive care with intention to discharge on the  27th with hospice at home.  Assessment & Plan:   Active Problems:   New onset seizure ([place])   Urinary tract infection without hematuria   Goals of care, counseling/discussion   Palliative care encounter   Acute metabolic encephalopathy-multifactorial -Patient does have dementia with signs of microvascular ischemic changes and severe global atrophy noted on brain MRI, no new or acute findings. -Concerns for seizure activity, toxic component from sepsis with UTI and hyponatremia. -EEG was negative for active epileptic waves; but patient demonstrated good response to the use of Keppra.  Following neurology recommendations his medications will be continued. -Will continue treatment for UTI as mentioned below with IV antibiotics -Continue addressing electrolytes abnormalities. -Alternate plan of care is to pursued home hospice at discharge -Telemetry will be discontinued.  Sepsis secondary to UTI -No growth on urine cultures noted yet -Continue Rocephin for now -Patient is afebrile and otherwise demonstrating some improvement in her mentation.  Hypertension: Patient was hypotensive on admission -At this moment blood pressure is rising -resume Coreg and lisinopril -Follow-up vital signs are needed will initiate treatment with hydralazine.  Dysphagia -Patient has been seen by speech therapy and recommendation for dysphagia 3 with thin liquids provided.  Diarrhea -No nausea, no vomiting, no fever, no abdominal pain -Very doubtful of active C. difficile infection. -Patient previously on Linzess and currently receiving IV antibiotics. -Will start Florastor.  Mild hypernatremia -Continue half-normal saline -Water has been also offered today. -Patient demonstrating poor oral intake  History of CVA with left hemiparesis -No new findings on brain MRI, however there is significant vascular  disease appreciated. -Resume aspirin and statins for secondary prevention.  Coagulase-negative  staph 1 out of 2 blood culture (aerobic bottles only). -Contaminant -Continue treatment for UTI with current antibiotics.  Acute kidney injury -Prerenal in nature secondary to dehydration  -acute UTI also playing a role -Renal function has improved and essentially back to normal with fluid resuscitation. -Continue to follow renal function trend.  DVT prophylaxis: Lovenox Code Status: DNR Family Communication: No family at bedside. Disposition Plan: Plan for likely disposition to home with hospice after palliative care evaluation.  Continue current antibiotics and Keppra.   Consultants:   Neurology  Palliative care  Procedures:   None  Antimicrobials:  Anti-infectives (From admission, onward)   Start     Dose/Rate Route Frequency Ordered Stop   07/03/19 1130  cefTRIAXone (ROCEPHIN) 2 g in sodium chloride 0.9 % 100 mL IVPB     2 g 200 mL/hr over 30 Minutes Intravenous Every 24 hours 07/03/19 0947     07/01/19 1130  cefTRIAXone (ROCEPHIN) 1 g in sodium chloride 0.9 % 100 mL IVPB  Status:  Discontinued     1 g 200 mL/hr over 30 Minutes Intravenous Every 24 hours 07/01/19 1110 07/03/19 0947   07/01/19 0515  cefTRIAXone (ROCEPHIN) 2 g in sodium chloride 0.9 % 100 mL IVPB     2 g 200 mL/hr over 30 Minutes Intravenous  Once 07/01/19 0509 07/01/19 0742      Subjective: Afebrile, no pain, no nausea, no vomiting.  Have been experiencing some difficulty taking medication by mouth.   Objective: Vitals:   07/02/19 2046 07/02/19 2100 07/03/19 0507 07/03/19 1159  BP: (!) 173/124 (!) 173/124 (!) 163/117 (!) 163/89  Pulse: 95 95 86 84  Resp: Temp: 97.8 F (36.6 C) 97.8 F (36.6 C) 98.4 F (36.9 C)   TempSrc: Oral Oral Oral   SpO2: 100% 100% 100% 100%  Weight:      Height:        Intake/Output Summary (Last 24 hours) at 07/03/2019 1712 Last data filed at 07/03/2019 0300 Gross per 24 hour  Intake 602.26 ml  Output -  Net 602.26 ml   Filed Weights    07/01/19 0334 07/01/19 1100 07/02/19 0500  Weight: 59 kg 52.6 kg 55.9 kg    Examination: General exam: Alert, awake, oriented x 1, following commands and being more interactive.  Currently afebrile. Respiratory system: Clear to auscultation. Respiratory effort normal.  No requiring oxygen supplementation. Cardiovascular system:RRR. No murmurs, rubs, gallops.  No JVD. Gastrointestinal system: Abdomen is nondistended, soft and nontender. No organomegaly or masses felt. Normal bowel sounds heard. Central nervous system: Alert and oriented x1 as mentioned above.  Unchanged left hemiparesis from prior stroke.  Generalized weakness and deconditioned. Extremities: No cyanosis, no clubbing, no edema. Skin: No rashes, no petechiae. Psychiatry: Mood & affect appropriate.       Data Reviewed: I have personally reviewed following labs and imaging studies  CBC: Recent Labs  Lab 07/01/19 0350 07/01/19 0354 07/02/19 0529 07/03/19 0522  WBC 13.2*  --  14.4* 13.0*  NEUTROABS 10.4*  --   --   --   HGB 13.1 13.9 10.6* 10.2*  HCT 42.5 41.0 34.5* 33.0*  MCV 87.4  --  89.1 88.9  PLT 235  --  187 190   Basic Metabolic Panel: Recent Labs  Lab 07/01/19 0345 07/01/19 0350 07/01/19 0354 07/02/19 0529 07/03/19 0522  NA  --  143 144 148* 148*  K  --  4.3 4.3 3.9 3.5  CL  --  111 109 118* 118*  CO2  --  22  --  20* 21*  GLUCOSE  --  136* 131* 98 91  BUN  --  45* 41* 41* 35*  CREATININE  --  1.23* 1.20* 1.01* 0.81  CALCIUM  --  8.7*  --  8.4* 8.8*  MG 2.1  --   --   --   --    GFR: Estimated Creatinine Clearance: 41.3 mL/min (by C-G formula based on SCr of 0.81 mg/dL).   Liver Function Tests: Recent Labs  Lab 07/01/19 0350  AST 19  ALT 12  ALKPHOS 258*  BILITOT 1.2  PROT 7.2  ALBUMIN 3.2*   Coagulation Profile: Recent Labs  Lab 07/01/19 0350  INR 1.2   Thyroid Function Tests: Recent Labs    07/01/19 0350 07/01/19 0630  TSH  --  0.130*  FREET4 1.65*  --    Anemia  Panel: Recent Labs    07/01/19 0630  VITAMINB12 149*   Sepsis Labs: Recent Labs  Lab 07/01/19 0455  LATICACIDVEN 0.8    Recent Results (from the past 240 hour(s))  Urine culture     Status: Abnormal   Collection Time: 07/01/19  4:55 AM   Specimen: Urine, Random  Result Value Ref Range Status   Specimen Description   Final    URINE, RANDOM Performed at [facility], [address] [place], [place] [address]    Special Requests   Final    NONE Performed at [facility], [address] [place], [place] [address]    Culture (A)  Final    >=100,000 COLONIES/mL AEROCOCCUS SPECIES Standardized susceptibility testing for this organism is not available. Performed at [facility], [address] [place], [place] [address]    Report Status 07/03/2019 FINAL  Final  Culture, blood (routine x 2)     Status: Abnormal (Preliminary result)   Collection Time: 07/01/19  4:55 AM   Specimen: BLOOD  Result Value Ref Range Status   Specimen Description   Final    BLOOD RIGHT IJ DRAWN BY RN Performed at [facility], [address] [place], [place] [address]    Special Requests   Final    BOTTLES DRAWN AEROBIC AND ANAEROBIC Blood Culture adequate volume Performed at [facility], [address] [place], [place] [address]    Culture  Setup Time   Final    GRAM POSITIVE COCCI AEROBIC BOTTLE ONLY Gram Stain Report Called to,Read Back By and Verified With: HYLTON L. AT 1353 ON [id number] BY THOMPSON S. Performed at [facility], [address] [place], [place] [address]    Culture (A)  Final    STAPHYLOCOCCUS SPECIES (COAGULASE NEGATIVE) THE SIGNIFICANCE OF ISOLATING THIS ORGANISM FROM A SINGLE SET OF BLOOD CULTURES WHEN MULTIPLE SETS ARE DRAWN IS UNCERTAIN. PLEASE NOTIFY THE MICROBIOLOGY DEPARTMENT WITHIN ONE WEEK IF SPECIATION AND SENSITIVITIES ARE REQUIRED. Performed at [facility], [address] [place], [place] [address]    Report Status PENDING  Incomplete  SARS  Coronavirus 2 by RT PCR (hospital order, performed in [facility] hospital lab) Nasopharyngeal Nasopharyngeal Swab     Status: None   Collection Time: 07/01/19  6:11 AM   Specimen: Nasopharyngeal Swab  Result Value Ref Range Status   SARS Coronavirus 2 NEGATIVE NEGATIVE Final    Comment: (NOTE) If result is NEGATIVE SARS-CoV-2 target nucleic acids are NOT DETECTED. The SARS-CoV-2 RNA is generally  detectable in upper and lower  respiratory specimens during the acute phase of infection. The lowest  concentration of SARS-CoV-2 viral copies this assay can detect is 250  copies / mL. A negative result does not preclude SARS-CoV-2 infection  and should not be used as the sole basis for treatment or other  patient management decisions.  A negative result may occur with  improper specimen collection / handling, submission of specimen other  than nasopharyngeal swab, presence of viral mutation(s) within the  areas targeted by this assay, and inadequate number of viral copies  (<250 copies / mL). A negative result must be combined with clinical  observations, patient history, and epidemiological information. If result is POSITIVE SARS-CoV-2 target nucleic acids are DETECTED. The SARS-CoV-2 RNA is generally detectable in upper and lower  respiratory specimens dur ing the acute phase of infection.  Positive  results are indicative of active infection with SARS-CoV-2.  Clinical  correlation with patient history and other diagnostic information is  necessary to determine patient infection status.  Positive results do  not rule out bacterial infection or co-infection with other viruses. If result is PRESUMPTIVE POSTIVE SARS-CoV-2 nucleic acids MAY BE PRESENT.   A presumptive positive result was obtained on the submitted specimen  and confirmed on repeat testing.  While 2019 novel coronavirus  (SARS-CoV-2) nucleic acids may be present in the submitted sample  additional confirmatory testing may be  necessary for epidemiological  and / or clinical management purposes  to differentiate between  SARS-CoV-2 and other Sarbecovirus currently known to infect humans.  If clinically indicated additional testing with an alternate test  methodology [phone]) is advised. The SARS-CoV-2 RNA is generally  detectable in upper and lower respiratory sp ecimens during the acute  phase of infection. The expected result is Negative. Fact Sheet for Patients:  StrictlyIdeas.no Fact Sheet for Healthcare Providers: BankingDealers.co.za This test is not yet approved or cleared by the [place] FDA and has been authorized for detection and/or diagnosis of SARS-CoV-2 by FDA under an Emergency Use Authorization (EUA).  This EUA will remain in effect (meaning this test can be used) for the duration of the COVID-19 declaration under Section 564(b)(1) of the Act, 21 U.S.C. section 360bbb-3(b)(1), unless the authorization is terminated or revoked sooner. Performed at [facility], [address] [place], [place] [address]   Culture, blood (routine x 2)     Status: None (Preliminary result)   Collection Time: 07/01/19  7:55 AM   Specimen: BLOOD  Result Value Ref Range Status   Specimen Description BLOOD NECK RIGHT DRAWN BY RN  Final   Special Requests   Final    BOTTLES DRAWN AEROBIC AND ANAEROBIC Blood Culture adequate volume   Culture   Final    NO GROWTH 2 DAYS Performed at [facility], [address] [place], [place] [address]    Report Status PENDING  Incomplete  MRSA PCR Screening     Status: None   Collection Time: 07/01/19 11:07 AM   Specimen: Nasal Mucosa; Nasopharyngeal  Result Value Ref Range Status   MRSA by PCR NEGATIVE NEGATIVE Final    Comment:        The GeneXpert MRSA Assay (FDA approved for NASAL specimens only), is one component of a comprehensive MRSA colonization surveillance program. It is not intended to diagnose MRSA  infection nor to guide or monitor treatment for MRSA infections. Performed at [facility], [address] [place],  [address]      Radiology  Studies: No results found.   Scheduled Meds: . aspirin EC  81 mg Oral Daily  . carvedilol  6.25 mg Oral BID  . Chlorhexidine Gluconate Cloth  6 each Topical Daily  . enoxaparin (LOVENOX) injection  40 mg Subcutaneous Q24H  . lisinopril  30 mg Oral Daily  . pravastatin  40 mg Oral q1800   Continuous Infusions: . cefTRIAXone (ROCEPHIN)  IV 2 g (07/03/19 1109)  . dextrose 5 % and 0.45% NaCl 60 mL/hr at 07/03/19 1037  . levETIRAcetam 500 mg (07/03/19 1420)     LOS: 2 days    Time spent: 30 minutes    Vassie Loll, MD [facility] Pager [phone]   07/03/2019, 5:12 PM

## 2019-07-03 NOTE — Progress Notes (Signed)
Pt offered po food and fluids, including ice cream, freezer meal and applesauce. Pt refuses, states, "I want my mama's food." Reminded pt that she is in the hospital and that her mother is not here. Pt then began crying, whimpering and moaning, "Mama! I want my mama!". Unable to console pt. Attempted pt reorient pt to place and situation without success. Attempted to divert patient's attention by offering water to drink, pt turns head away, clenches lips and shakes head no. Continues to intermittently yell out for her mother.

## 2019-07-03 NOTE — Progress Notes (Signed)
Pt was able to swallow water (approx 175ml) via straw from cup without difficulty. However, pt is refusing per oral meds, states, "I don't need them". Refused to even open her mouth for further sips of water, stating, "You ain't gon' fool me! I ain't taking them!" Attempted oral care due to dry lips and oral mucosa, pt again refused to open mouth. Did allow me to wipe her lips with wet washcloth and apply vaseline to lips, but could not swab mouth or tongue.

## 2019-07-03 NOTE — Progress Notes (Signed)
Palliative:  HPI: 85 y.o. female  with past medical history of stroke with residual left hemiparesis, HTN admitted on 07/01/2019 found unresponsive with twitching movements by daughter who is her caregiver with concern for seizure. Also found to have UTI and blood cultures pending. Mental status has improved and WBC trending down.   I met today with Emma Crawford who is in bed that is sit up and is more alert and talkative today. Voice is garbled and difficult to understand at times. 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 Asking about going home. She tells me that she will take her pills for Emma Crawford at home.   I called and spoke with Emma Crawford (daughter). I explained that her mother is much more alert and interactive and explained above interaction. She states that this is typical for her mother to become frustrated when asked to repeat herself and Emma Crawford has to coax her into taking her medications even at home but is usually successful. Emma Crawford is happy to hear her mother's mental status has improved.   I continued our conversation from yesterday and Emma Crawford continues to desire hospice support at home. She will continue with caregivers she receives from Medicaid (60 hours a week) along with hospice. She says that she will need a hospital bed given her mother's decline over the past month to better care for her. She has many questions about care and I reassure her about the supportive team of hospice and their availability 24/7 so she should will not be alone in figuring this out. I encouraged Emma Crawford to contact caregivers today to restart as tomorrow is a holiday. 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 Will await hospital bed and connect with hospice services. All questions/concerns addressed. Emotional support provided.    Exam: Alert, oriented to person and place at times. Responses mostly appropriate although garbled speech. No distress. Thin, frail, elderly. Breathing regular, unlabored. Abd flat, soft. Generalized weakness.   Plan: - Home with hospice.  - Continue treatment of infection as indicated.  - See consult note from yesterday. Extensive conversation with daughter regarding goals for comfort and expectation of continued decline.   [place], NP Palliative Medicine Team Pager [phone] (Please see [email or web address] for schedule) Team Phone [phone]    Greater than 50%  of this time was spent counseling and coordinating care related to the above assessment and plan

## 2019-07-03 NOTE — TOC Initial Note (Signed)
Transition of Care [facility]) - Initial/Assessment Note    Patient Details  Name: Chanise Habeck MRN: [id number] Date of Birth: Aug 30, 1933  Transition [facility]) CM/SW Contact:    Ismael Treptow, Chauncey Reading, RN Phone Number: 07/03/2019, 1:31 PM  Expected Discharge Plan: Home w Hospice Care Barriers to Discharge: Equipment Delay         Expected Discharge Plan and Services Expected Discharge Plan: [place]                         DME Arranged: Hospital bed           [place] Date [place]: 07/03/19 Time HH Agency Contacted: [place] Representative spoke with at [place]: [place]  Prior Living Arrangements/Services   Lives with:: Adult Children                Admission diagnosis:  Encephalopathy acute [G93.40] Urinary tract infection without hematuria, site unspecified [N39.0] Patient Active Problem List   Diagnosis Date Noted  . Urinary tract infection without hematuria   . Goals of care, counseling/discussion   . Palliative care encounter   . New onset seizure ([place]) 07/01/2019   PCP:  Dulce Sellar, MD Pharmacy:   [facility], [address] [place] [address] Phone: [phone] Fax: [phone]     Social Determinants of Health (SDOH) Interventions    Readmission Risk Interventions No flowsheet data found.

## 2019-07-03 NOTE — TOC Progression Note (Signed)
Transition of Care [facility]) - Progression Note    Patient Details  Name: Emma Crawford MRN: [id number] Date of Birth: 01-10-1934  [facility]) CM/SW Contact  Katty Fretwell, Chauncey Reading, RN Phone Number: 07/03/2019, 2:24 PM  Clinical Narrative:   Hospital bed will not be delivered until Friday. Patient will need to remain in hospital until then. Cassandra of hospice to follow up with dtr, Euline.     Expected Discharge Plan: Home w Hospice Care Barriers to Discharge: Equipment Delay  Expected Discharge Plan and Services Expected Discharge Plan: [place]                         DME Arranged: Hospital bed           [place] Date [place]: 07/03/19 Time [place]: [place] Representative spoke with at [place]: [place] Determinants of Health ([place]) Interventions    Readmission Risk Interventions No flowsheet data found.

## 2019-07-04 DIAGNOSIS — I10 Essential (primary) hypertension: Secondary | ICD-10-CM | POA: Diagnosis not present

## 2019-07-04 DIAGNOSIS — N390 Urinary tract infection, site not specified: Secondary | ICD-10-CM | POA: Diagnosis not present

## 2019-07-04 DIAGNOSIS — G9340 Encephalopathy, unspecified: Secondary | ICD-10-CM | POA: Diagnosis not present

## 2019-07-04 DIAGNOSIS — A419 Sepsis, unspecified organism: Secondary | ICD-10-CM | POA: Diagnosis not present

## 2019-07-04 DIAGNOSIS — Z515 Encounter for palliative care: Secondary | ICD-10-CM | POA: Diagnosis not present

## 2019-07-04 DIAGNOSIS — R569 Unspecified convulsions: Secondary | ICD-10-CM | POA: Diagnosis not present

## 2019-07-04 LAB — CULTURE, BLOOD (ROUTINE X 2): Special Requests: ADEQUATE

## 2019-07-04 LAB — BASIC METABOLIC PANEL
Anion gap: 8 (ref 5–15)
BUN: 24 mg/dL — ABNORMAL HIGH (ref 8–23)
CO2: 23 mmol/L (ref 22–32)
Calcium: 8.7 mg/dL — ABNORMAL LOW (ref 8.9–10.3)
Chloride: 113 mmol/L — ABNORMAL HIGH (ref 98–111)
Creatinine, Ser: 0.72 mg/dL (ref 0.44–1.00)
GFR calc Af Amer: >60 mL/min (ref >60)
GFR calc non Af Amer: >60 mL/min (ref >60)
Glucose, Bld: 123 mg/dL — ABNORMAL HIGH (ref 70–99)
Potassium: 2.9 mmol/L — ABNORMAL LOW (ref 3.5–5.1)
Sodium: 144 mmol/L (ref 135–145)

## 2019-07-04 MED ORDER — MORPHINE SULFATE (CONCENTRATE) 10 MG/0.5ML PO SOLN: 5.0000 mg | ORAL | Status: DC | PRN

## 2019-07-04 MED ORDER — FENTANYL CITRATE (PF) 100 MCG/2ML IJ SOLN: 6.2500 ug | INTRAMUSCULAR | Status: DC | PRN

## 2019-07-04 NOTE — Progress Notes (Signed)
Palliative:  HPI: 85 y.o.femalewith past medical history of stroke with residual left hemiparesis, HTNadmitted on 11/23/2020found unresponsive with twitching movements by daughter who is her caregiver with concern for seizure.Also found to have UTI and blood cultures pending.Mental status has improved and WBC trending down. Plans for home with hospice.   I met today at Emma Crawford' bedside. No family present. She is resting comfortably. Offered to turn television on for her but she would like to rest a little more. Denies pain. She does arouse and answer questions appropriately but with some delay at times.   Discussed with RN who reports that intake is still poor but she is taking medication in strawberry ice cream and will eat some ice cream. She reportedly complains that she does not like the food. Spoke with Dr. Dyann Kief who is following culture results and will d/c tomorrow home with hospice and po antibiotics as indicated.   Exam: Sleepy but arouses. Alert to person and place. Answers questions appropriately. No distress. Thin, frail, elderly. Breathing regular, unlabored. Abd soft, non-tender. Generalized weakness.   Plan: - Home with hospice tomorrow.  - PRN medications adjusted to ensure comfort.   15 min  Vinie Sill, NP Palliative Medicine Team Pager [phone] (Please see [email or web address] for schedule) Team Phone [phone]    Greater than 50%  of this time was spent counseling and coordinating care related to the above assessment and plan

## 2019-07-04 NOTE — Progress Notes (Signed)
PROGRESS NOTE    Ceriah Kohler  LNL:[id number] DOB: [birth year] DOA: 07/01/2019 PCP: Dulce Sellar, MD   Brief Narrative:  Per HPI: [name] y.o.female,wh/o HTN, HL, on aspirin, previous stroke w L hemiparesis - no movement L arm, decreased movement L leg, not standing since hospitalization 1 mo ago p/w altered mental status 11/22, pt w episodes of abnormal movements.  Patient lives at home with daughter who helps to take care of her. Daughter went to give her her 8pmmedications and found patient hard to arouse. She monitored patient throughout the night, and then when patient started having abnormal body movements around 3 - 4in the morning they came into the ER. Patient does not have a history of seizures. Patient is sedated, possibly postictal, is not able to provide history. No family at bedside.  11/23: Patient was admitted for seizure work-up and was loaded with Keppra and started on Keppra 500 twice daily with EEG pending.  Brain MRI with no acute findings.  She is noted to have prior history of CVA and hemiparesis.  She is also noted to have sepsis secondary to UTI and has been started on Rocephin with urine cultures pending.  Her blood pressures have improved without use of pressors.  She is DNR and may transfer to telemetry.  Full neurology consultation as well as EEG pending.  We will plan to consult palliative care for goals of care discussion.  11/24: Patient seen and evaluated today and transferred to [place] bed.  She is noted to have some diarrhea which C. difficile and GI panel is currently pending.  Palliative care consultation currently pending.  11/25: Overall demonstrating improvement in her mentation.  Refusing some of her oral medications.  No further seizure or jerking activity appreciated.  Equipment from hospice at some point to be delivered on 07/05/2019.  Continue current antibiotics and supportive care with intention to discharge on the  27th with hospice at home.  Assessment & Plan:   Active Problems:   New onset seizure ([place])   Urinary tract infection without hematuria   Goals of care, counseling/discussion   Palliative care encounter   Acute metabolic encephalopathy-multifactorial -Patient does have dementia with signs of microvascular ischemic changes and severe global atrophy noted on brain MRI, no new or acute findings. -Concerns for seizure activity, toxic component from sepsis with UTI and hyponatremia. -EEG was negative for active epileptic waves; but patient demonstrated good response to the use of Keppra.  Following neurology recommendations his medications will be continued. -Will continue treatment for UTI as mentioned below with IV antibiotics -Continue addressing electrolytes abnormalities. -Alternate plan of care is to pursued home hospice at discharge -Telemetry will be discontinued.  Sepsis secondary to UTI -No growth on urine cultures noted yet -Continue Rocephin for now -Patient is afebrile and otherwise demonstrating some improvement in her mentation.  Hypertension: Patient was hypotensive on admission -At this moment blood pressure is rising -resume Coreg and lisinopril -Follow-up vital signs are needed will initiate treatment with hydralazine.  Dysphagia -Patient has been seen by speech therapy and recommendation for dysphagia 3 with thin liquids provided.  Diarrhea -No nausea, no vomiting, no fever, no abdominal pain -Very doubtful of active C. difficile infection. -Patient previously on Linzess and currently receiving IV antibiotics. -Will start Florastor.  Mild hypernatremia -Continue half-normal saline -Water has been also offered today. -Patient demonstrating poor oral intake  History of CVA with left hemiparesis -No new findings on brain MRI, however there is significant vascular  disease appreciated. -Resume aspirin and statins for secondary prevention.  Coagulase-negative  staph 1 out of 2 blood culture (aerobic bottles only). -Contaminant -Continue treatment for UTI with current antibiotics.  Acute kidney injury -Prerenal in nature secondary to dehydration  -acute UTI also playing a role -Renal function has improved and essentially back to normal with fluid resuscitation. -Continue to follow renal function trend.  DVT prophylaxis: Lovenox Code Status: DNR Family Communication: No family at bedside. Disposition Plan: Planning discharge home on 07/05/2019 with hospice follow-up.  Equipment will be delivered prior to patient's discharge.  Follow final culture results, continue current antibiotics and continue Keppra.  Consultants:   Neurology  Palliative care  Procedures:   None  Antimicrobials:  Anti-infectives (From admission, onward)   Start     Dose/Rate Route Frequency Ordered Stop   07/03/19 1130  cefTRIAXone (ROCEPHIN) 2 g in sodium chloride 0.9 % 100 mL IVPB     2 g 200 mL/hr over 30 Minutes Intravenous Every 24 hours 07/03/19 0947     07/01/19 1130  cefTRIAXone (ROCEPHIN) 1 g in sodium chloride 0.9 % 100 mL IVPB  Status:  Discontinued     1 g 200 mL/hr over 30 Minutes Intravenous Every 24 hours 07/01/19 1110 07/03/19 0947   07/01/19 0515  cefTRIAXone (ROCEPHIN) 2 g in sodium chloride 0.9 % 100 mL IVPB     2 g 200 mL/hr over 30 Minutes Intravenous  Once 07/01/19 0509 07/01/19 0742      Subjective: Afebrile, no chest pain, no nausea, no vomiting.  Patient mentation has continued to improve and is close to baseline at this moment.  She is oriented to place and person.  Still having poor oral intake, but has been able to take oral medications with strawberry ice cream.  Objective: Vitals:   07/04/19 0500 07/04/19 0626 07/04/19 0845 07/04/19 0901  BP:  (!) 193/98 (!) 200/102 (!) 173/93  Pulse:  75 79 78  Resp:  16 16   Temp:  98 F (36.7 C)    TempSrc:  Oral    SpO2:  100% 100%   Weight: 57 kg     Height:        Intake/Output  Summary (Last 24 hours) at 07/04/2019 1354 Last data filed at 07/04/2019 0500 Gross per 24 hour  Intake 1540.31 ml  Output 800 ml  Net 740.31 ml   Filed Weights   07/01/19 1100 07/02/19 0500 07/04/19 0500  Weight: 52.6 kg 55.9 kg 57 kg    Examination: General exam: Alert, awake, oriented x 2, probably back to her baseline.  Afebrile, no nausea, no vomiting.  Taking medications better today was given with ice cream.  Expressed not liking hospital food. Respiratory system: Clear to auscultation. Respiratory effort normal.  No requiring oxygen supplementation. Cardiovascular system:RRR. No murmurs, rubs, gallops. Gastrointestinal system: Abdomen is nondistended, soft and nontender. No organomegaly or masses felt. Normal bowel sounds heard. Central nervous system: Alert and oriented. No new focal neurological deficits; patient with chronic left hemiparesis. Extremities: No cyanosis or clubbing. Skin: No rashes, no petechiae Psychiatry: Mood & affect appropriate.    Data Reviewed: I have personally reviewed following labs and imaging studies  CBC: Recent Labs  Lab 07/01/19 0350 07/01/19 0354 07/02/19 0529 07/03/19 0522  WBC 13.2*  --  14.4* 13.0*  NEUTROABS 10.4*  --   --   --   HGB 13.1 13.9 10.6* 10.2*  HCT 42.5 41.0 34.5* 33.0*  MCV 87.4  --  89.1  88.9  PLT 235  --  187 190   Basic Metabolic Panel: Recent Labs  Lab 07/01/19 0345 07/01/19 0350 07/01/19 0354 07/02/19 0529 07/03/19 0522 07/04/19 0637  NA  --  143 144 148* 148* 144  K  --  4.3 4.3 3.9 3.5 2.9*  CL  --  111 109 118* 118* 113*  CO2  --  22  --  20* 21* 23  GLUCOSE  --  136* 131* 98 91 123*  BUN  --  45* 41* 41* 35* 24*  CREATININE  --  1.23* 1.20* 1.01* 0.81 0.72  CALCIUM  --  8.7*  --  8.4* 8.8* 8.7*  MG 2.1  --   --   --   --   --    GFR: Estimated Creatinine Clearance: 42.1 mL/min (by C-G formula based on SCr of 0.72 mg/dL).   Liver Function Tests: Recent Labs  Lab 07/01/19 0350  AST 19   ALT 12  ALKPHOS 258*  BILITOT 1.2  PROT 7.2  ALBUMIN 3.2*   Coagulation Profile: Recent Labs  Lab 07/01/19 0350  INR 1.2   Thyroid Function Tests: No results for input(s): TSH, T4TOTAL, FREET4, T3FREE, THYROIDAB in the last 72 hours. Anemia Panel: No results for input(s): VITAMINB12, FOLATE, FERRITIN, TIBC, IRON, RETICCTPCT in the last 72 hours. Sepsis Labs: Recent Labs  Lab 07/01/19 0455  LATICACIDVEN 0.8    Recent Results (from the past 240 hour(s))  Urine culture     Status: Abnormal   Collection Time: 07/01/19  4:55 AM   Specimen: Urine, Random  Result Value Ref Range Status   Specimen Description   Final    URINE, RANDOM Performed at [facility], [address], [place], [place] [address]    Special Requests   Final    NONE Performed at [facility], [address] [place], [place] [address]    Culture (A)  Final    >=100,000 COLONIES/mL AEROCOCCUS SPECIES Standardized susceptibility testing for this organism is not available. Performed at [facility], [address], [place], [place] [address]    Report Status 07/03/2019 FINAL  Final  Culture, blood (routine x 2)     Status: Abnormal   Collection Time: 07/01/19  4:55 AM   Specimen: BLOOD  Result Value Ref Range Status   Specimen Description   Final    BLOOD RIGHT IJ DRAWN BY RN Performed at [facility], [address] [place], [place] [address]    Special Requests   Final    BOTTLES DRAWN AEROBIC AND ANAEROBIC Blood Culture adequate volume Performed at [facility], [address], [place], [place] [address]    Culture  Setup Time   Final    GRAM POSITIVE COCCI AEROBIC BOTTLE ONLY Gram Stain Report Called to,Read Back By and Verified With: HYLTON L. AT 1353 ON [id number] BY THOMPSON S. Performed at [facility], [address] [place], [place] [address]    Culture (A)  Final    STAPHYLOCOCCUS SPECIES (COAGULASE NEGATIVE) THE SIGNIFICANCE OF ISOLATING THIS ORGANISM FROM A SINGLE SET OF BLOOD  CULTURES WHEN MULTIPLE SETS ARE DRAWN IS UNCERTAIN. PLEASE NOTIFY THE MICROBIOLOGY DEPARTMENT WITHIN ONE WEEK IF SPECIATION AND SENSITIVITIES ARE REQUIRED. Performed at [facility], [address] [place], [place] [address]    Report Status 07/04/2019 FINAL  Final  SARS Coronavirus 2 by RT PCR (hospital order, performed in [facility] hospital lab) Nasopharyngeal Nasopharyngeal Swab     Status: None  Collection Time: 07/01/19  6:11 AM   Specimen: Nasopharyngeal Swab  Result Value Ref Range Status   SARS Coronavirus 2 NEGATIVE NEGATIVE Final    Comment: (NOTE) If result is NEGATIVE SARS-CoV-2 target nucleic acids are NOT DETECTED. The SARS-CoV-2 RNA is generally detectable in upper and lower  respiratory specimens during the acute phase of infection. The lowest  concentration of SARS-CoV-2 viral copies this assay can detect is 250  copies / mL. A negative result does not preclude SARS-CoV-2 infection  and should not be used as the sole basis for treatment or other  patient management decisions.  A negative result may occur with  improper specimen collection / handling, submission of specimen other  than nasopharyngeal swab, presence of viral mutation(s) within the  areas targeted by this assay, and inadequate number of viral copies  (<250 copies / mL). A negative result must be combined with clinical  observations, patient history, and epidemiological information. If result is POSITIVE SARS-CoV-2 target nucleic acids are DETECTED. The SARS-CoV-2 RNA is generally detectable in upper and lower  respiratory specimens dur ing the acute phase of infection.  Positive  results are indicative of active infection with SARS-CoV-2.  Clinical  correlation with patient history and other diagnostic information is  necessary to determine patient infection status.  Positive results do  not rule out bacterial infection or co-infection with other viruses. If result is PRESUMPTIVE POSTIVE  SARS-CoV-2 nucleic acids MAY BE PRESENT.   A presumptive positive result was obtained on the submitted specimen  and confirmed on repeat testing.  While 2019 novel coronavirus  (SARS-CoV-2) nucleic acids may be present in the submitted sample  additional confirmatory testing may be necessary for epidemiological  and / or clinical management purposes  to differentiate between  SARS-CoV-2 and other Sarbecovirus currently known to infect humans.  If clinically indicated additional testing with an alternate test  methodology [phone]) is advised. The SARS-CoV-2 RNA is generally  detectable in upper and lower respiratory sp ecimens during the acute  phase of infection. The expected result is Negative. Fact Sheet for Patients:  BoilerBrush.com.cy Fact Sheet for Healthcare Providers: https://pope.com/ This test is not yet approved or cleared by the [place] FDA and has been authorized for detection and/or diagnosis of SARS-CoV-2 by FDA under an Emergency Use Authorization (EUA).  This EUA will remain in effect (meaning this test can be used) for the duration of the COVID-19 declaration under Section 564(b)(1) of the Act, 21 U.S.C. section 360bbb-3(b)(1), unless the authorization is terminated or revoked sooner. Performed at [facility], [address] [place], [place] [address]   Culture, blood (routine x 2)     Status: None (Preliminary result)   Collection Time: 07/01/19  7:55 AM   Specimen: BLOOD  Result Value Ref Range Status   Specimen Description BLOOD NECK RIGHT DRAWN BY RN  Final   Special Requests   Final    BOTTLES DRAWN AEROBIC AND ANAEROBIC Blood Culture adequate volume   Culture   Final    NO GROWTH 3 DAYS Performed at [facility], [address], [place], [place] [address]    Report Status PENDING  Incomplete  MRSA PCR Screening     Status: None   Collection Time: 07/01/19 11:07 AM   Specimen: Nasal Mucosa;  Nasopharyngeal  Result Value Ref Range Status   MRSA by PCR NEGATIVE NEGATIVE Final    Comment:        The GeneXpert MRSA Assay (FDA approved for NASAL  specimens only), is one component of a comprehensive MRSA colonization surveillance program. It is not intended to diagnose MRSA infection nor to guide or monitor treatment for MRSA infections. Performed at [facility], [address] [place], [place] [address]      Radiology Studies: No results found.   Scheduled Meds: . aspirin EC  81 mg Oral Daily  . carvedilol  6.25 mg Oral BID  . Chlorhexidine Gluconate Cloth  6 each Topical Daily  . enoxaparin (LOVENOX) injection  40 mg Subcutaneous Q24H  . lisinopril  30 mg Oral Daily  . pravastatin  40 mg Oral q1800  . saccharomyces boulardii  250 mg Oral BID   Continuous Infusions: . cefTRIAXone (ROCEPHIN)  IV 2 g (07/04/19 1155)  . dextrose 5 % and 0.45% NaCl 60 mL/hr at 07/04/19 0315  . levETIRAcetam 500 mg (07/04/19 0626)     LOS: 3 days    Time spent: 30 minutes    Vassie Loll, MD [facility] Pager [phone]   07/04/2019, 1:54 PM

## 2019-07-04 NOTE — Progress Notes (Signed)
Dr. Dyann Kief in to evaluate patient. Notified of earlier elevated B/P and meds given. Also notified pt refusing breakfast. Pt did take 1/2 cup strawberry ice cream with oral meds.

## 2019-07-04 NOTE — Progress Notes (Signed)
1230 pm: Pt alert and talkative. Took 25% of her lunch without difficulty. Denies any further c/o headache.  1300: Pt with one episode of urinary incontinence (purewick dislodged). Pt cleaned of urine and smear of brown stool, purewick replaced, pt turned to side. 1725: Pt has been sleeping all afternoon. Awakens to voice and name called, but right back to sleep. Pt has been turned q2h.  Supper tray delivered, pt set up for meal. Pt only took one bite of her meal plate then clenched her mouth shut and motioned head "no". When asked if she wanted ice cream, pt said, "yes". Pt has taken 3/4 cup of strawberry ice cream and appox 175ml tea. Now refusing further food or liquid. Oral care performed and pt repositioned for comfort.

## 2019-07-04 NOTE — Progress Notes (Signed)
Pt moaning, "My head hurts!" and rubbing top of head. B/P 200/102. Normadyne admin'd per prn order for SBP > 180. Tylenol 650mg  po also admin for complaint of pain. Repeat B/P 173/93.

## 2019-07-05 DIAGNOSIS — I639 Cerebral infarction, unspecified: Secondary | ICD-10-CM

## 2019-07-05 DIAGNOSIS — G9340 Encephalopathy, unspecified: Secondary | ICD-10-CM

## 2019-07-05 DIAGNOSIS — G8194 Hemiplegia, unspecified affecting left nondominant side: Secondary | ICD-10-CM

## 2019-07-05 DIAGNOSIS — N390 Urinary tract infection, site not specified: Secondary | ICD-10-CM | POA: Diagnosis not present

## 2019-07-05 DIAGNOSIS — I69391 Dysphagia following cerebral infarction: Secondary | ICD-10-CM

## 2019-07-05 DIAGNOSIS — E785 Hyperlipidemia, unspecified: Secondary | ICD-10-CM

## 2019-07-05 DIAGNOSIS — I10 Essential (primary) hypertension: Secondary | ICD-10-CM

## 2019-07-05 DIAGNOSIS — R569 Unspecified convulsions: Secondary | ICD-10-CM | POA: Diagnosis not present

## 2019-07-05 MED ORDER — POTASSIUM CHLORIDE CRYS ER 20 MEQ PO TBCR: 40.0000 meq | EXTENDED_RELEASE_TABLET | ORAL | Status: DC

## 2019-07-05 MED ORDER — CEFDINIR 300 MG PO CAPS: 300.0000 mg | ORAL_CAPSULE | Freq: Two times a day (BID) | ORAL | 0 refills | Status: AC

## 2019-07-05 MED ORDER — LEVETIRACETAM 500 MG PO TABS: 500.0000 mg | ORAL_TABLET | Freq: Two times a day (BID) | ORAL | 0 refills | Status: AC

## 2019-07-05 MED ORDER — SACCHAROMYCES BOULARDII 250 MG PO CAPS: 250.0000 mg | ORAL_CAPSULE | Freq: Two times a day (BID) | ORAL | 0 refills | Status: AC

## 2019-07-05 MED ORDER — MORPHINE SULFATE (CONCENTRATE) 10 MG/0.5ML PO SOLN: 5.0000 mg | Freq: Four times a day (QID) | ORAL | 0 refills | Status: AC | PRN

## 2019-07-05 NOTE — Discharge Summary (Signed)
Physician Discharge Summary  Emma Crawford WER:[id number] DOB: [birth year] DOA: 07/01/2019  PCP: Dulce Sellar, MD  Admit date: 07/01/2019 Discharge date: 07/05/2019  Time spent: 35 minutes  Recommendations for Outpatient Follow-up:  1. Main goal of care is symptomatic management and comfort. 2. Patient is DNR/DNI at this moment. 3. Minimize/prevent hospital admission in the future if at all cause possible.   Discharge Diagnoses:  Active Problems:   New onset seizure ([place])   Urinary tract infection without hematuria   Goals of care, counseling/discussion   Palliative care encounter   Encephalopathy acute   Cerebrovascular accident (CVA) ([place])   Dysphagia as late effect of cerebrovascular accident (CVA)   Essential hypertension   Hyperlipidemia   Left hemiparesis ([place])   Discharge Condition: Stable and improved.  Patient discharged home with home hospice.  Diet recommendation: Dysphagia 3 diet with thin liquids; heart healthy.  Filed Weights   07/02/19 0500 07/04/19 0500 07/05/19 0500  Weight: 55.9 kg 57 kg 56.9 kg    History of present illness:  As per H&P written by Dr. Darrell Jewel 85 y.o. female, w h/o HTN, HL, on aspirin, previous stroke w L hemiparesis - no movement L arm, decreased movement L leg, not standing since hospitalization 1 mo ago p/w altered mental status 11/22, pt w episodes of abnormal movements.  Patient lives at home with daughter who helps to take care of her.  Daughter went to give her her 8pm medications and found patient hard to arouse.  She monitored patient throughout the night, and then when patient started having abnormal body movements around 3 - 4 in the morning they came into the ER.  Patient does not have a history of seizures.  Patient is sedated, possibly postictal, is not able to provide history.  No family at bedside.  In the ER Neuro was consulted the recommended Ativan 1 mg, loading dose of Keppra, continuing Keppra at 500  twice daily, EEG, MR brain with and without contrast, seizure precautions, neuro checks.  ED provider reports that patient has been nonverbal and difficult to arouse for the entire time in the ED.  He reports that there was some rhythmic movement of her shoulder, and lipsmacking but that was all resolved with Ativan.  T head without contrast showed 1 no acute cortically based infarct or acute intracranial hemorrhage identified.  2 advanced small vessel disease with no prior study for comparison.  3 chronic right maxillary sinusitis.  UDS was done that had nothing detected.  Alcohol level was less than 10.  UA was suspicious for UTI with greater than 50 white blood cells trace leukocytes and turbid amber results.  Glucose is 131.  Otology revealed a leukocytosis of 13.2.  Chemistry showed CKD stage III with a GFR of 47, creatinine 1.23, BUN 40s, electrolytes normal.  Admission was requested for continued work-up of altered mental status that is thought to be secondary to new onset seizure.  Hospital Course:  Acute metabolic encephalopathy-multifactorial -Patient does have dementia with signs of microvascular ischemic changes and severe global atrophy noted on brain MRI, no new or acute findings. -Concerns for seizure activity, toxic component from sepsis with UTI and hyponatremia. -EEG was negative for active epileptic waves; but patient demonstrated good response to the use of Keppra.  Following neurology recommendations she will be discharge on keppra BID. -Will complete treatment for UTI as mentioned below with oral antibiotics. -Per discussion of goals of care and future advance directive by palliative care service; will  pursuit home hospice at discharge  Sepsis secondary to UTI -No specific growth on urine cultures noted  -Patient was kept on Rocephin IV and will be discharged on cefdinir to complete antibiotic therapy. -Patient is afebrile and otherwise demonstrating improvement in her mentation  and overall condition..  Hypertension: Patient was hypotensive on admission -At this moment blood pressure is stable and rising -resume Coreg and lisinopril -Encouraged to follow heart healthy diet.  Dysphagia -Patient has been seen by speech therapy and recommendation for dysphagia 3 with thin liquids provided.  Diarrhea -No nausea, no vomiting, no fever, no abdominal pain -Very doubtful of active C. difficile infection. -Will discharge on Florastor.  Hypokalemia -In the setting of GI losses and poor oral intake -Electrolytes repleted -Advised to maintain adequate hydration and nutrition.  Mild hypernatremia -Improved/resolved with appropriate use of half-normal saline fluid resuscitation. -Advised to maintain adequate hydration. -Patient demonstrating poor oral intake overall.  History of CVA with left hemiparesis -No new findings on brain MRI, however there is significant vascular disease appreciated. -Resume aspirin and statins for secondary prevention. -Follow dysphagia 3 diet with thin liquids as recommended by speech therapy.  Coagulase-negative staph 1 out of 2 blood culture (aerobic bottles only). -Appears to be a contaminant -Continue treatment for UTI with current antibiotics. -Patient has remained afebrile and there is no signs of systemic infection currently.  Acute kidney injury -Prerenal in nature secondary to dehydration  -acute UTI also playing a role -Renal function has improved and essentially back to normal -Advised to maintain adequate hydration. -Safe to resume the use of lisinopril.  Procedures:  See below for x-ray reports  Consultations:  Palliative care service  Discharge Exam: Vitals:   07/05/19 0535 07/05/19 0646  BP: (!) 192/97 (!) 177/91  Pulse: 83 80  Resp: 18   Temp: 98 F (36.7 C)   SpO2: 100%     General: Oriented to place and person; no fever, no nausea, no vomiting.  Still with poor oral intake but otherwise in no  acute distress. Cardiovascular: S1 and S2, no rubs, no gallops, no murmurs. Respiratory: Good oxygen saturation on room air; normal respiratory effort, no using accessory muscles. Abdomen: Soft, nontender, nondistended, positive bowel sounds Extremities: No cyanosis, no clubbing.  Discharge Instructions   Discharge Instructions    Diet - low sodium heart healthy   Complete by: As directed    Discharge instructions   Complete by: As directed    Maintain adequate hydration Follow dysphagia 3 diet with thin liquids Take medications as prescribed Follow instructions on recommendations from hospice service.     Allergies as of 07/05/2019      Reactions   Penicillins Rash   Unknown Did it involve swelling of the face/tongue/throat, SOB, or low BP? No Did it involve sudden or severe rash/hives, skin peeling, or any reaction on the inside of your mouth or nose? No Did you need to seek medical attention at a hospital or doctor's office? No When did it last happen?Childhood If all above answers are "NO", may proceed with cephalosporin use.      Medication List    TAKE these medications   aspirin EC 81 MG tablet Take 81 mg by mouth daily.   carvedilol 6.25 MG tablet Commonly known as: COREG Take 6.25 mg by mouth 2 (two) times daily.   cefdinir 300 MG capsule Commonly known as: OMNICEF Take 1 capsule (300 mg total) by mouth 2 (two) times daily.   Derma-Smoothe/FS Body 0.01 % Oil  Generic drug: Fluocinolone Acetonide Body Apply 1 application topically 3 (three) times daily. Apply a thin film to left arm and right leg three times a day   gabapentin 100 MG capsule Commonly known as: NEURONTIN Take 100 mg by mouth 3 (three) times daily.   levETIRAcetam 500 MG tablet Commonly known as: Keppra Take 1 tablet (500 mg total) by mouth 2 (two) times daily.   lidocaine 5 % ointment Commonly known as: XYLOCAINE Apply 1 application topically 3 (three) times daily as needed.  Apply topically to left shoulder three times a day as needed for pain   Linzess 72 MCG capsule Generic drug: linaclotide Take 72 mcg by mouth daily as needed.   lisinopril 30 MG tablet Commonly known as: ZESTRIL Take 30 mg by mouth daily.   morphine CONCENTRATE 10 MG/0.5ML Soln concentrated solution Take 0.25 mLs (5 mg total) by mouth every 6 (six) hours as needed for severe pain or shortness of breath.   pravastatin 40 MG tablet Commonly known as: PRAVACHOL Take 40 mg by mouth daily.   saccharomyces boulardii 250 MG capsule Commonly known as: FLORASTOR Take 1 capsule (250 mg total) by mouth 2 (two) times daily.      Allergies  Allergen Reactions  . Penicillins Rash    Unknown Did it involve swelling of the face/tongue/throat, SOB, or low BP? No Did it involve sudden or severe rash/hives, skin peeling, or any reaction on the inside of your mouth or nose? No Did you need to seek medical attention at a hospital or doctor's office? No When did it last happen?Childhood If all above answers are "NO", may proceed with cephalosporin use.   Follow-up Information    Eliezer Lofts, MD. Schedule an appointment as soon as possible for a visit in 2 week(s).   Specialty: General Practice Contact information: [address] [place] [address] [phone]           The results of significant diagnostics from this hospitalization (including imaging, microbiology, ancillary and laboratory) are listed below for reference.    Significant Diagnostic Studies: Ct Head Wo Contrast  Result Date: 07/01/2019 CLINICAL DATA:  85 year old female with unexplained altered mental status. Found unresponsive. EXAM: CT HEAD WITHOUT CONTRAST TECHNIQUE: Contiguous axial images were obtained from the base of the skull through the vertex without intravenous contrast. COMPARISON:  None. FINDINGS: Brain: Perisylvian cerebral volume loss. No midline shift, mass effect, or evidence of  intracranial mass lesion. No ventriculomegaly. No acute intracranial hemorrhage identified. Widespread and confluent bilateral cerebral white matter hypodensity. Heterogeneous hypodensity in the bilateral deep gray nuclei, especially the thalami. Brainstem and cerebellum appear within normal limits. No cortically based acute infarct identified. Vascular: Calcified atherosclerosis at the skull base. No suspicious intracranial vascular hyperdensity. Skull: No acute osseous abnormality identified. Sinuses/Orbits: Right maxillary sinus opacified with periosteal thickening. Right frontal sinus mucosal thickening and osteoma, but other paranasal sinuses are well pneumatized. Tympanic cavities and mastoids are clear. Other: Visualized orbits and scalp soft tissues are within normal limits. IMPRESSION: 1. No acute cortically based infarct or acute intracranial hemorrhage identified. 2. Advanced small vessel disease with no prior study for comparison. 3. Chronic right maxillary sinusitis. Electronically Signed   By: Odessa Fleming M.D.   On: 07/01/2019 04:18   Mr Brain Wo Contrast  Result Date: 07/01/2019 CLINICAL DATA:  Seizure, new, abnormal neuro exam, nontraumatic EXAM: MRI HEAD WITHOUT CONTRAST TECHNIQUE: Multiplanar, multiecho pulse sequences of the brain and surrounding structures were obtained without intravenous contrast. COMPARISON:  Head CT 07/01/2019 FINDINGS: Brain: Multiple sequences are motion degraded. Most notably, there is moderate motion degradation of the sagittal T1 weighted imaging. The diffusion-weighted imaging is of good quality. No convincing evidence of acute infarct. No evidence of intracranial mass. No midline shift or extra-axial fluid collection. No chronic intracranial blood products. Advanced chronic small vessel ischemic disease within the cerebral white matter, also involving the bilateral basal ganglia and thalami. Small focus of chronic hemorrhage within the right thalamus. Moderate  generalized parenchymal atrophy. Vascular: Flow voids maintained within the proximal large arterial vessels. Skull and upper cervical spine: No focal marrow lesion identified on motion degraded imaging. Sinuses/Orbits: Visualized orbits demonstrate no acute abnormality. Complete opacification of the right maxillary sinus. Mucosal thickening also present within the right frontal and ethmoid sinuses. IMPRESSION: 1. Intermittently motion degraded examination. 2. No evidence of acute intracranial abnormality, including acute infarct. 3. No seizure focus is definitively identified, although a dedicated seizure protocol was not performed. 4. Moderate generalized parenchymal atrophy with advanced chronic small vessel ischemic disease. 5. Chronic right maxillary sinusitis. Electronically Signed   By: Jackey Loge DO   On: 07/01/2019 09:46   Dg Chest Port 1 View  Result Date: 07/01/2019 CLINICAL DATA:  85 year old female is unresponsive. Line placement. EXAM: PORTABLE CHEST 1 VIEW COMPARISON:  Portable chest 01/23/2019. FINDINGS: Portable AP semi upright view at 0612 hours. Right IJ approach central line in place. Tip projects at the cavoatrial junction level. No pneumothorax. Stable cardiac size and mediastinal contours. Continued low lung volumes. Allowing for portable technique the lungs are clear. Negative visible bowel gas pattern. No acute osseous abnormality identified. IMPRESSION: 1. Right IJ central line placed, tip at the cavoatrial junction level. 2. No pneumothorax or acute cardiopulmonary abnormality. Electronically Signed   By: Odessa Fleming M.D.   On: 07/01/2019 06:38    Microbiology: Recent Results (from the past 240 hour(s))  Urine culture     Status: Abnormal   Collection Time: 07/01/19  4:55 AM   Specimen: Urine, Random  Result Value Ref Range Status   Specimen Description   Final    URINE, RANDOM Performed at [facility], [address], [place], [place] [address]    Special Requests   Final     NONE Performed at [facility], [address], [place], [place] [address]    Culture (A)  Final    >=100,000 COLONIES/mL AEROCOCCUS SPECIES Standardized susceptibility testing for this organism is not available. Performed at [facility], [address], [place], [place] [address]    Report Status 07/03/2019 FINAL  Final  Culture, blood (routine x 2)     Status: Abnormal   Collection Time: 07/01/19  4:55 AM   Specimen: BLOOD  Result Value Ref Range Status   Specimen Description   Final    BLOOD RIGHT IJ DRAWN BY RN Performed at [facility], [address], [place], [place] [address]    Special Requests   Final    BOTTLES DRAWN AEROBIC AND ANAEROBIC Blood Culture adequate volume Performed at [facility], [address] [place], [place] [address]    Culture  Setup Time   Final    GRAM POSITIVE COCCI AEROBIC BOTTLE ONLY Gram Stain Report Called to,Read Back By and Verified With: HYLTON L. AT 1353 ON [id number] BY THOMPSON S. Performed at [facility], [address] [place], [place] [address]    Culture (A)  Final    STAPHYLOCOCCUS SPECIES (COAGULASE NEGATIVE) THE SIGNIFICANCE OF ISOLATING  THIS ORGANISM FROM A SINGLE SET OF BLOOD CULTURES WHEN MULTIPLE SETS ARE DRAWN IS UNCERTAIN. PLEASE NOTIFY THE MICROBIOLOGY DEPARTMENT WITHIN ONE WEEK IF SPECIATION AND SENSITIVITIES ARE REQUIRED. Performed at [facility], [address] [place], [place] [address]    Report Status 07/04/2019 FINAL  Final  SARS Coronavirus 2 by RT PCR (hospital order, performed in [facility] lab) Nasopharyngeal Nasopharyngeal Swab     Status: None   Collection Time: 07/01/19  6:11 AM   Specimen: Nasopharyngeal Swab  Result Value Ref Range Status   SARS Coronavirus 2 NEGATIVE NEGATIVE Final    Comment: (NOTE) If result is NEGATIVE SARS-CoV-2 target nucleic acids are NOT DETECTED. The SARS-CoV-2 RNA is generally detectable in upper and lower  respiratory specimens during the acute phase  of infection. The lowest  concentration of SARS-CoV-2 viral copies this assay can detect is 250  copies / mL. A negative result does not preclude SARS-CoV-2 infection  and should not be used as the sole basis for treatment or other  patient management decisions.  A negative result may occur with  improper specimen collection / handling, submission of specimen other  than nasopharyngeal swab, presence of viral mutation(s) within the  areas targeted by this assay, and inadequate number of viral copies  (<250 copies / mL). A negative result must be combined with clinical  observations, patient history, and epidemiological information. If result is POSITIVE SARS-CoV-2 target nucleic acids are DETECTED. The SARS-CoV-2 RNA is generally detectable in upper and lower  respiratory specimens dur ing the acute phase of infection.  Positive  results are indicative of active infection with SARS-CoV-2.  Clinical  correlation with patient history and other diagnostic information is  necessary to determine patient infection status.  Positive results do  not rule out bacterial infection or co-infection with other viruses. If result is PRESUMPTIVE POSTIVE SARS-CoV-2 nucleic acids MAY BE PRESENT.   A presumptive positive result was obtained on the submitted specimen  and confirmed on repeat testing.  While 2019 novel coronavirus  (SARS-CoV-2) nucleic acids may be present in the submitted sample  additional confirmatory testing may be necessary for epidemiological  and / or clinical management purposes  to differentiate between  SARS-CoV-2 and other Sarbecovirus currently known to infect humans.  If clinically indicated additional testing with an alternate test  methodology [phone] is advised. The SARS-CoV-2 RNA is generally  detectable in upper and lower respiratory sp ecimens during the acute  phase of infection. The expected result is Negative. Fact Sheet for Patients:   BoilerBrush.com.cyhttps://www.fda.gov/media/136312/download Fact Sheet for Healthcare Providers: https://pope.com/https://www.fda.gov/media/136313/download This test is not yet approved or cleared by the [place] FDA and has been authorized for detection and/or diagnosis of SARS-CoV-2 by FDA under an Emergency Use Authorization (EUA).  This EUA will remain in effect (meaning this test can be used) for the duration of the COVID-19 declaration under Section 564(b)(1) of the Act, 21 U.S.C. section 360bbb-3(b)(1), unless the authorization is terminated or revoked sooner. Performed at [facility], [address] [place], [place] [address]   Culture, blood (routine x 2)     Status: None (Preliminary result)   Collection Time: 07/01/19  7:55 AM   Specimen: BLOOD  Result Value Ref Range Status   Specimen Description BLOOD NECK RIGHT DRAWN BY RN  Final   Special Requests   Final    BOTTLES DRAWN AEROBIC AND ANAEROBIC Blood Culture adequate volume   Culture   Final    NO  GROWTH 4 DAYS Performed at [facility], [address] [place], [place] [address]    Report Status PENDING  Incomplete  MRSA PCR Screening     Status: None   Collection Time: 07/01/19 11:07 AM   Specimen: Nasal Mucosa; Nasopharyngeal  Result Value Ref Range Status   MRSA by PCR NEGATIVE NEGATIVE Final    Comment:        The GeneXpert MRSA Assay (FDA approved for NASAL specimens only), is one component of a comprehensive MRSA colonization surveillance program. It is not intended to diagnose MRSA infection nor to guide or monitor treatment for MRSA infections. Performed at [facility], [address], [place], [place] [address]      Labs: Basic Metabolic Panel: Recent Labs  Lab 07/01/19 0345 07/01/19 0350 07/01/19 0354 07/02/19 0529 07/03/19 0522 07/04/19 0637  NA  --  143 144 148* 148* 144  K  --  4.3 4.3 3.9 3.5 2.9*  CL  --  111 109 118* 118* 113*  CO2  --  22  --  20* 21* 23  GLUCOSE  --  136* 131* 98 91 123*  BUN  --  45* 41*  41* 35* 24*  CREATININE  --  1.23* 1.20* 1.01* 0.81 0.72  CALCIUM  --  8.7*  --  8.4* 8.8* 8.7*  MG 2.1  --   --   --   --   --    Liver Function Tests: Recent Labs  Lab 07/01/19 0350  AST 19  ALT 12  ALKPHOS 258*  BILITOT 1.2  PROT 7.2  ALBUMIN 3.2*   CBC: Recent Labs  Lab 07/01/19 0350 07/01/19 0354 07/02/19 0529 07/03/19 0522  WBC 13.2*  --  14.4* 13.0*  NEUTROABS 10.4*  --   --   --   HGB 13.1 13.9 10.6* 10.2*  HCT 42.5 41.0 34.5* 33.0*  MCV 87.4  --  89.1 88.9  PLT 235  --  187 190   Signed:  Vassie Loll MD.  [facility] 07/05/2019, 8:39 AM

## 2019-07-05 NOTE — TOC Transition Note (Signed)
Transition of Care [facility]) - CM/SW Discharge Note   Patient Details  Name: Emma Crawford MRN: [id number] Date of Birth: [birth year]  Transition of [facility]) CM/SW Contact:  Latima Hamza, Chauncey Reading, RN Phone Number: 07/05/2019, 11:00 AM   Clinical Narrative:   Patient discharging home today. Hospital bed has arrived. Daughter at home, ready to receive patient, EMS arranged. Cassandra of hospice aware of DC today.     Final next level of care: Home w Hospice Care Barriers to Discharge: Barriers Resolved                  Name of family member notified: Euline Patient and family notified of of transfer: 07/05/19  Discharge Plan and Services                DME Arranged: Hospital bed           [place] Date [place]: 07/03/19 Time [place]: [place] Representative spoke with at [place]: [place] Determinants of Health ([place]) Interventions     Readmission Risk Interventions No flowsheet data found.

## 2019-07-06 LAB — CULTURE, BLOOD (ROUTINE X 2)
Culture: NO GROWTH
Special Requests: ADEQUATE

## 2019-09-09 DEATH — deceased

## 2019-12-07 IMAGING — DX DG CHEST 2V
2 series · 2 of 2 positions shown · non-contrast
Comparison: None.

CLINICAL DATA: Fall with shoulder pain

EXAM:
CHEST - 2 VIEW

[chest ap]
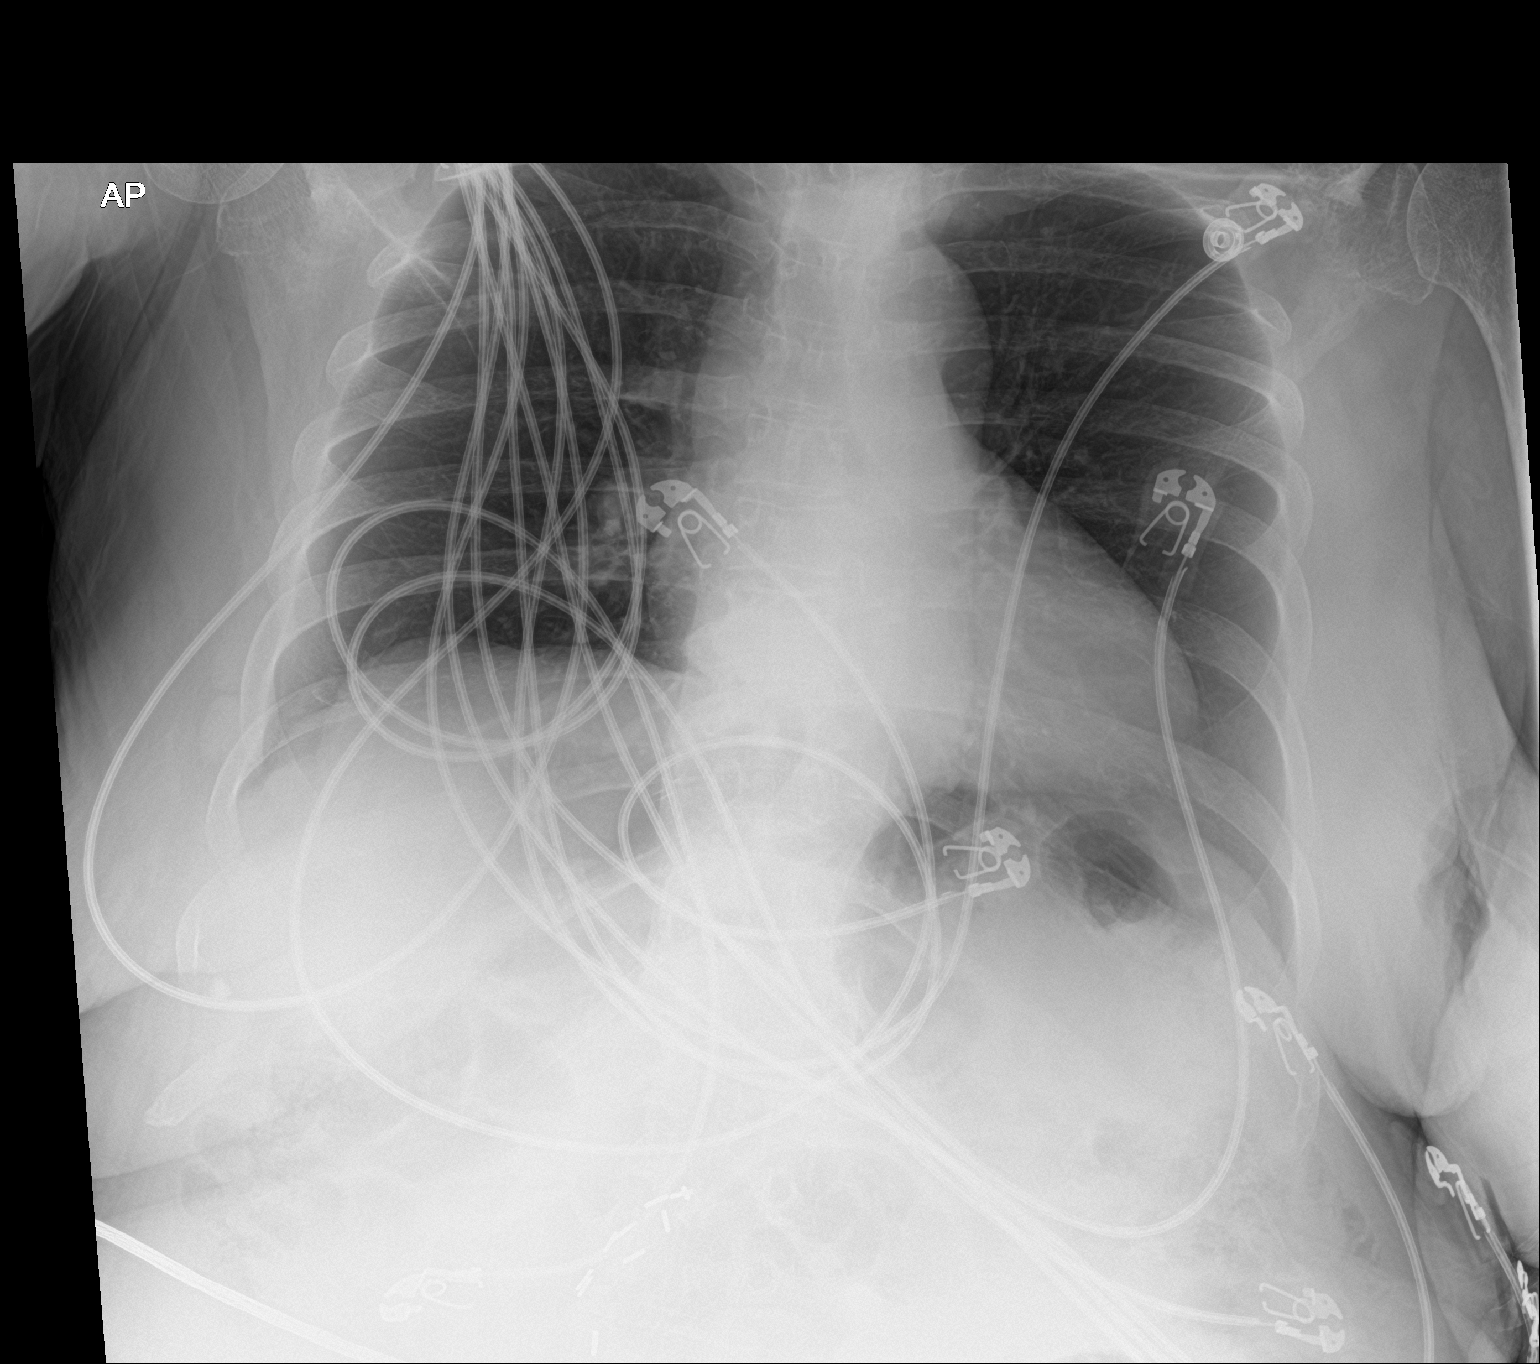

[chest lat]
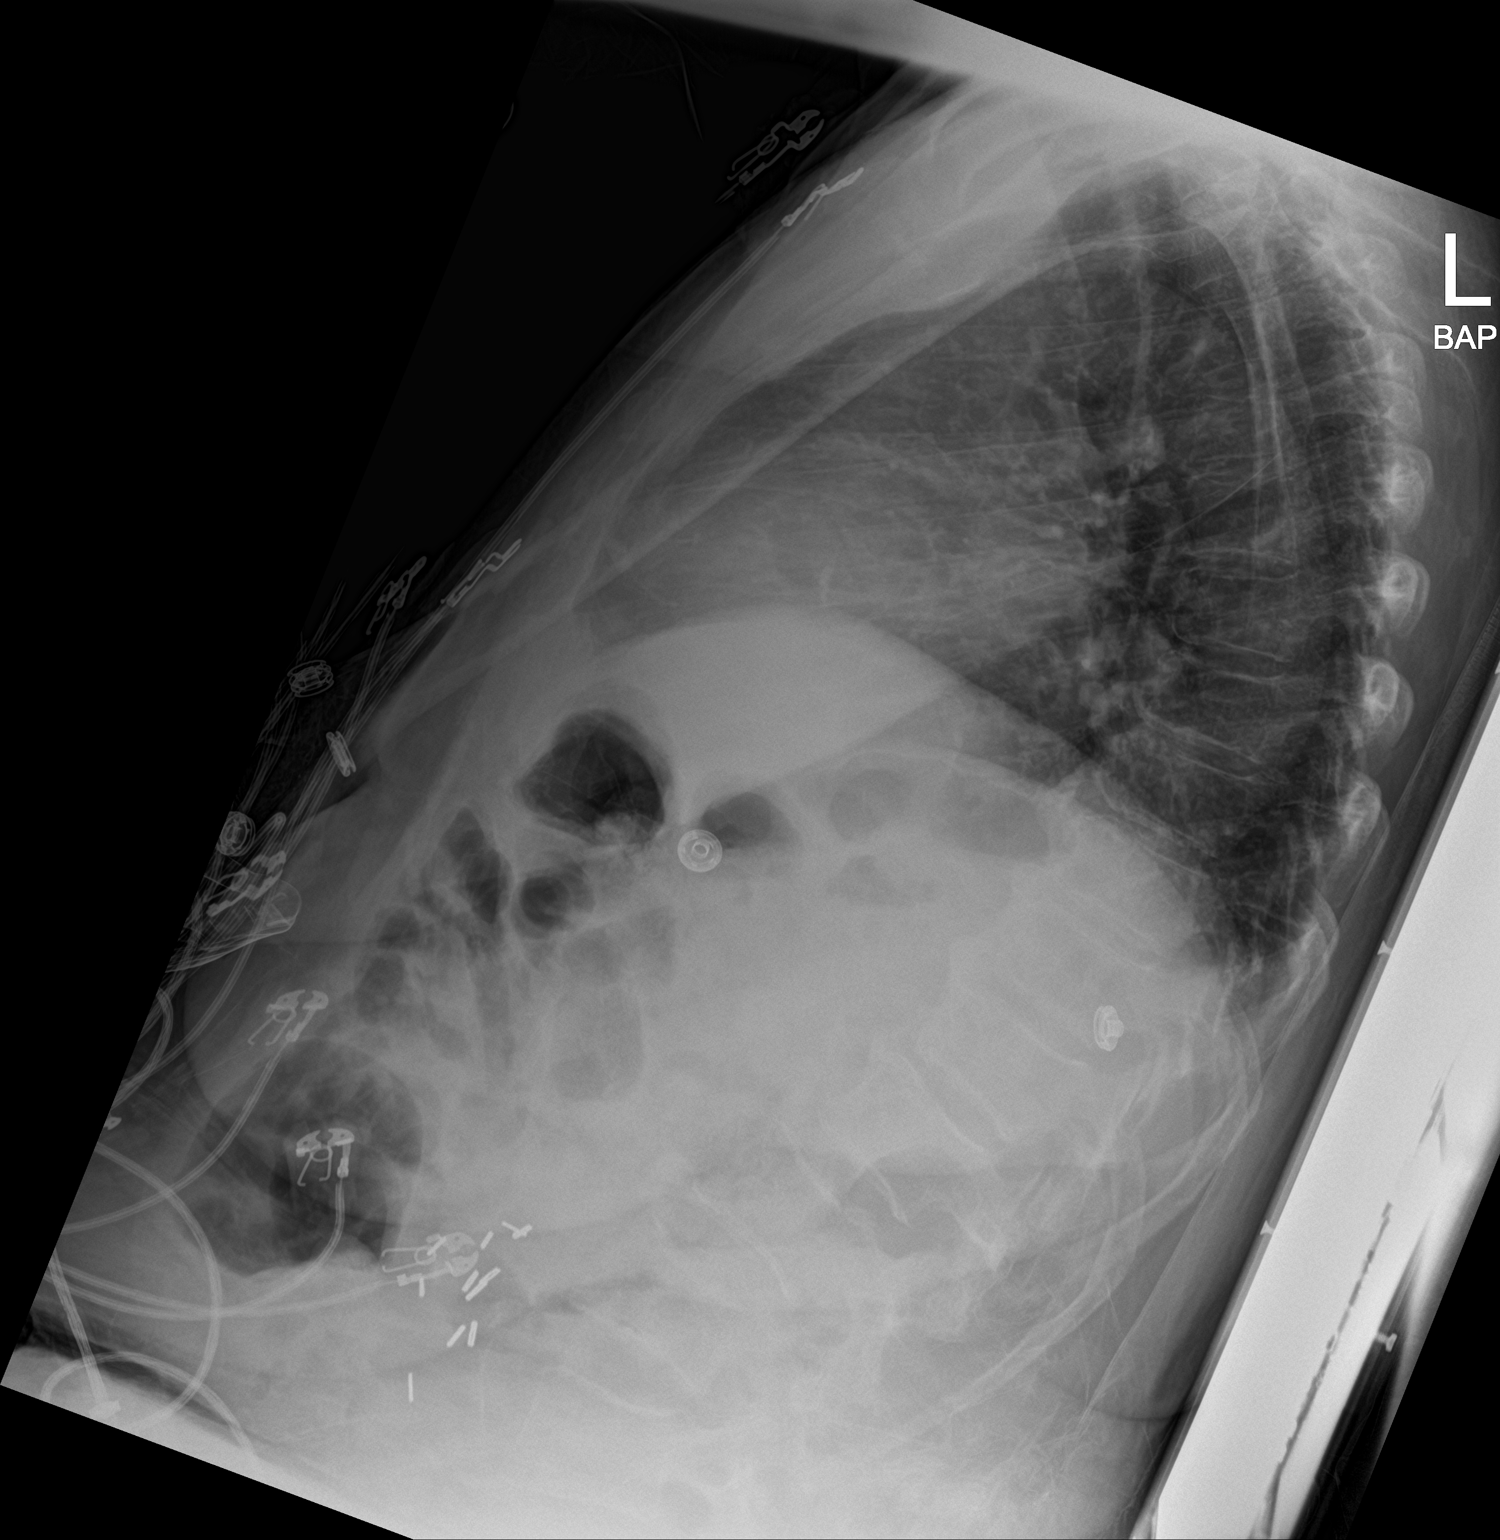

[2 of 2 positions shown; findings below may reference images not displayed]

FINDINGS: The heart size and mediastinal contours are within normal limits.
Both lungs are clear. The visualized skeletal structures are
unremarkable.
IMPRESSION: No active cardiopulmonary disease.
